# Patient Record
Sex: Female | Born: 1965 | Race: Black or African American | Hispanic: No | Marital: Married | Smoking: Never smoker
Health system: Southern US, Community
[De-identification: ages and names within clinical notes are randomized; demographics above are authoritative.]

## PROBLEM LIST (undated history)

## (undated) DIAGNOSIS — I10 Essential (primary) hypertension: Secondary | ICD-10-CM

## (undated) HISTORY — PX: THYROIDECTOMY, PARTIAL: SHX18

---

## 2007-08-06 HISTORY — PX: BREAST BIOPSY: SHX20

## 2020-09-01 DIAGNOSIS — Z03818 Encounter for observation for suspected exposure to other biological agents ruled out: Secondary | ICD-10-CM | POA: Diagnosis not present

## 2020-09-21 DIAGNOSIS — M1711 Unilateral primary osteoarthritis, right knee: Secondary | ICD-10-CM | POA: Diagnosis not present

## 2020-09-21 DIAGNOSIS — M1712 Unilateral primary osteoarthritis, left knee: Secondary | ICD-10-CM | POA: Diagnosis not present

## 2020-09-21 DIAGNOSIS — M722 Plantar fascial fibromatosis: Secondary | ICD-10-CM | POA: Diagnosis not present

## 2020-09-21 DIAGNOSIS — M7741 Metatarsalgia, right foot: Secondary | ICD-10-CM | POA: Diagnosis not present

## 2020-10-19 DIAGNOSIS — Z20822 Contact with and (suspected) exposure to covid-19: Secondary | ICD-10-CM | POA: Diagnosis not present

## 2020-12-03 ENCOUNTER — Encounter: Payer: Self-pay | Admitting: Intensive Care

## 2020-12-03 ENCOUNTER — Other Ambulatory Visit: Payer: Self-pay

## 2020-12-03 ENCOUNTER — Emergency Department
Admission: EM | Admit: 2020-12-03 | Discharge: 2020-12-03 | Disposition: A | Discharge status: Home / Self Care | Payer: BC Managed Care – PPO | Attending: Physician Assistant | Admitting: Physician Assistant

## 2020-12-03 DIAGNOSIS — T783XXA Angioneurotic edema, initial encounter: Secondary | ICD-10-CM | POA: Diagnosis not present

## 2020-12-03 DIAGNOSIS — T7840XA Allergy, unspecified, initial encounter: Secondary | ICD-10-CM

## 2020-12-03 DIAGNOSIS — I10 Essential (primary) hypertension: Secondary | ICD-10-CM | POA: Insufficient documentation

## 2020-12-03 DIAGNOSIS — K130 Diseases of lips: Secondary | ICD-10-CM | POA: Diagnosis not present

## 2020-12-03 DIAGNOSIS — Z79899 Other long term (current) drug therapy: Secondary | ICD-10-CM | POA: Insufficient documentation

## 2020-12-03 DIAGNOSIS — T50995A Adverse effect of other drugs, medicaments and biological substances, initial encounter: Secondary | ICD-10-CM | POA: Diagnosis not present

## 2020-12-03 HISTORY — DX: Essential (primary) hypertension: I10

## 2020-12-03 MED ORDER — HYDROXYZINE HCL 50 MG PO TABS: 50.0000 mg | ORAL_TABLET | Freq: Three times a day (TID) | ORAL | 0 refills | Status: DC | PRN

## 2020-12-03 MED ORDER — DIPHENHYDRAMINE HCL 50 MG/ML IJ SOLN
25.0000 mg | Freq: Once | INTRAMUSCULAR | Status: AC
  Administered 2020-12-03: 25 mg via INTRAVENOUS
  Filled 2020-12-03: qty 1

## 2020-12-03 MED ORDER — METOCLOPRAMIDE HCL 5 MG/ML IJ SOLN
20.0000 mg | Freq: Once | INTRAVENOUS | Status: AC
  Administered 2020-12-03: 20 mg via INTRAVENOUS
  Filled 2020-12-03: qty 4

## 2020-12-03 MED ORDER — SODIUM CHLORIDE 0.9 % IV BOLUS
1000.0000 mL | Freq: Once | INTRAVENOUS | Status: AC
  Administered 2020-12-03: 1000 mL via INTRAVENOUS

## 2020-12-03 MED ORDER — METHYLPREDNISOLONE 4 MG PO TBPK: ORAL_TABLET | ORAL | 0 refills | Status: DC

## 2020-12-03 MED ORDER — METHYLPREDNISOLONE SODIUM SUCC 125 MG IJ SOLR
80.0000 mg | Freq: Once | INTRAMUSCULAR | Status: AC
  Administered 2020-12-03: 80 mg via INTRAVENOUS
  Filled 2020-12-03: qty 2

## 2020-12-03 NOTE — Discharge Instructions (Addendum)
Discontinue BC powder/aspirin.

## 2020-12-03 NOTE — ED Provider Notes (Signed)
[facility] Emergency Department Provider Note   ____________________________________________   Event Date/Time   First MD Initiated Contact with Patient 12/03/20 [phone]     (approximate)  I have reviewed the triage vital signs and the nursing notes.   HISTORY  Chief Complaint Allergic Reaction    HPI Julia Ashley is a 55 y.o. female patient presents with lip edema and rash after taking a Goody powder this morning.  Patient states the only other oral thing she ate was oatmeal.  Denies dyspnea or other anaphylactic signs or symptoms.        Past Medical History:  Diagnosis Date  . Hypertension     There are no problems to display for this patient.   History reviewed. No pertinent surgical history.  Prior to Admission medications   Medication Sig Start Date End Date Taking? Authorizing Provider  hydrOXYzine (ATARAX/VISTARIL) 50 MG tablet Take 1 tablet (50 mg total) by mouth 3 (three) times daily as needed for itching. 12/03/20  Yes Joni Reining, PA-C  methylPREDNISolone (MEDROL DOSEPAK) 4 MG TBPK tablet Take Tapered dose as directed 12/03/20  Yes Joni Reining, PA-C    Allergies Lisinopril  History reviewed. No pertinent family history.  Social History Social History   Tobacco Use  . Smoking status: Never Smoker  . Smokeless tobacco: Never Used  Substance Use Topics  . Alcohol use: Never  . Drug use: Never    Review of Systems Constitutional: No fever/chills Eyes: No visual changes. ENT: No sore throat. Cardiovascular: Denies chest pain. Respiratory: Denies shortness of breath. Gastrointestinal: No abdominal pain.  No nausea, no vomiting.  No diarrhea.  No constipation. Genitourinary: Negative for dysuria. Musculoskeletal: Negative for back pain. Skin: Positive for rash.  Oral lip edema. Neurological: Negative for headaches, focal weakness or numbness. Allergic/Immunilogical:  Lisinopril  ____________________________________________   PHYSICAL EXAM:  VITAL SIGNS: ED Triage Vitals  Enc Vitals Group     BP 12/03/20 0937 (!) 181/112     Pulse Rate 12/03/20 0937 (!) 110     Resp 12/03/20 0937 20     Temp 12/03/20 0937 99.3 F (37.4 C)     Temp Source 12/03/20 0937 Oral     SpO2 12/03/20 0937 97 %     Weight 12/03/20 0939 261 lb (118.4 kg)     Height 12/03/20 0939 6' (1.829 m)     Head Circumference --      Peak Flow --      Pain Score 12/03/20 0939 0     Pain Loc --      Pain Edu? --      Excl. in GC? --     Constitutional: Alert and oriented. Well appearing and in no acute distress. Eyes: Conjunctivae are normal. PERRL. EOMI. Head: Atraumatic. Nose: No congestion/rhinnorhea. Mouth/Throat: Mucous membranes are moist.  Oropharynx non-erythematous. Neck: No stridor.  Hematological/Lymphatic/Immunilogical: No cervical lymphadenopathy. Cardiovascular: Normal rate, regular rhythm. Grossly normal heart sounds.  Good peripheral circulation.  Elevated blood pressure.  Patient has not taking blood pressure medication this morning.   Respiratory: Normal respiratory effort.  No retractions. Lungs CTAB. Gastrointestinal: Soft and nontender. No distention. No abdominal bruits. No CVA tenderness. Genitourinary: Deferred Musculoskeletal: No lower extremity tenderness nor edema.  No joint effusions. Neurologic:  Normal speech and language. No gross focal neurologic deficits are appreciated. No gait instability. Skin:  Skin is warm, dry and intact.  Diffuse macular rash noted. Psychiatric: Mood and affect are normal. Speech and behavior  are normal.  ____________________________________________   LABS (all labs ordered are listed, but only abnormal results are displayed)  Labs Reviewed - No data to display ____________________________________________  EKG   ____________________________________________  RADIOLOGY I, Joni Reining, personally viewed and  evaluated these images (plain radiographs) as part of my medical decision making, as well as reviewing the written report by the radiologist.  ED MD interpretation:    Official radiology report(s): No results found.  ____________________________________________   PROCEDURES  Procedure(s) performed (including Critical Care):  Procedures   ____________________________________________   INITIAL IMPRESSION / ASSESSMENT AND PLAN / ED COURSE  As part of my medical decision making, I reviewed the following data within the electronic MEDICAL RECORD NUMBER         Patient presents or edema and rash status post taking BC powders this morning.  Patient complaining physical exam consistent with drug rash and mild angioedema.  Patient is responding well to IV steroids and antihistamines.  Patient given discharge care instructions and a prescription for Medrol Dosepak and Atarax.  Patient advised follow-up PCP.  Return to ED if condition worsens.      ____________________________________________   FINAL CLINICAL IMPRESSION(S) / ED DIAGNOSES  Final diagnoses:  Allergic reaction to drug, initial encounter  Angioedema, initial encounter     ED Discharge Orders         Ordered    methylPREDNISolone (MEDROL DOSEPAK) 4 MG TBPK tablet        12/03/20 1116    hydrOXYzine (ATARAX/VISTARIL) 50 MG tablet  3 times daily PRN        12/03/20 1116          *Please note:  [name] was evaluated in Emergency Department on 12/03/2020 for the symptoms described in the history of present illness. She was evaluated in the context of the global COVID-19 pandemic, which necessitated consideration that the patient might be at risk for infection with the SARS-CoV-2 virus that causes COVID-19. Institutional protocols and algorithms that pertain to the evaluation of patients at risk for COVID-19 are in a state of rapid change based on information released by regulatory bodies including the CDC and  federal and state organizations. These policies and algorithms were followed during the patient's care in the ED.  Some ED evaluations and interventions may be delayed as a result of limited staffing during and the pandemic.*   Note:  This document was prepared using Dragon voice recognition software and may include unintentional dictation errors.    Joni Reining, PA-C 12/03/20 1122    Gilles Chiquito, MD 12/03/20 1255

## 2020-12-03 NOTE — ED Triage Notes (Addendum)
Patient c/o allergic reaction to bc goody powder about 20-30 minutes after taking this morning. Patient has rash all over and c/o swelling on face. NAD noted. No trouble breathing. Patient does clear throat every so often

## 2020-12-03 NOTE — ED Notes (Signed)
Provided discharge instructions. Verbalized understanding.  

## 2020-12-18 DIAGNOSIS — Z20822 Contact with and (suspected) exposure to covid-19: Secondary | ICD-10-CM | POA: Diagnosis not present

## 2020-12-18 DIAGNOSIS — Z03818 Encounter for observation for suspected exposure to other biological agents ruled out: Secondary | ICD-10-CM | POA: Diagnosis not present

## 2020-12-21 DIAGNOSIS — T783XXA Angioneurotic edema, initial encounter: Secondary | ICD-10-CM | POA: Diagnosis not present

## 2020-12-21 DIAGNOSIS — E7800 Pure hypercholesterolemia, unspecified: Secondary | ICD-10-CM | POA: Diagnosis not present

## 2020-12-21 DIAGNOSIS — M2041 Other hammer toe(s) (acquired), right foot: Secondary | ICD-10-CM | POA: Diagnosis not present

## 2020-12-21 DIAGNOSIS — M2042 Other hammer toe(s) (acquired), left foot: Secondary | ICD-10-CM | POA: Diagnosis not present

## 2020-12-21 DIAGNOSIS — R718 Other abnormality of red blood cells: Secondary | ICD-10-CM | POA: Diagnosis not present

## 2020-12-21 DIAGNOSIS — Z6841 Body Mass Index (BMI) 40.0 and over, adult: Secondary | ICD-10-CM | POA: Diagnosis not present

## 2020-12-21 DIAGNOSIS — M1712 Unilateral primary osteoarthritis, left knee: Secondary | ICD-10-CM | POA: Diagnosis not present

## 2020-12-21 DIAGNOSIS — L509 Urticaria, unspecified: Secondary | ICD-10-CM | POA: Diagnosis not present

## 2020-12-21 DIAGNOSIS — Z6836 Body mass index (BMI) 36.0-36.9, adult: Secondary | ICD-10-CM | POA: Diagnosis not present

## 2020-12-21 DIAGNOSIS — Z79899 Other long term (current) drug therapy: Secondary | ICD-10-CM | POA: Diagnosis not present

## 2020-12-21 DIAGNOSIS — I10 Essential (primary) hypertension: Secondary | ICD-10-CM | POA: Diagnosis not present

## 2020-12-21 DIAGNOSIS — M1711 Unilateral primary osteoarthritis, right knee: Secondary | ICD-10-CM | POA: Diagnosis not present

## 2020-12-21 DIAGNOSIS — R21 Rash and other nonspecific skin eruption: Secondary | ICD-10-CM | POA: Diagnosis not present

## 2020-12-27 DIAGNOSIS — Z20822 Contact with and (suspected) exposure to covid-19: Secondary | ICD-10-CM | POA: Diagnosis not present

## 2021-01-08 ENCOUNTER — Ambulatory Visit (INDEPENDENT_AMBULATORY_CARE_PROVIDER_SITE_OTHER): Payer: BC Managed Care – PPO | Admitting: Allergy

## 2021-01-08 ENCOUNTER — Other Ambulatory Visit: Payer: Self-pay

## 2021-01-08 ENCOUNTER — Encounter: Payer: Self-pay | Admitting: Allergy

## 2021-01-08 VITALS — BP 142/82 | HR 104 | Temp 98.0°F | Resp 20 | Ht 70.0 in | Wt 262.6 lb

## 2021-01-08 DIAGNOSIS — L508 Other urticaria: Secondary | ICD-10-CM | POA: Diagnosis not present

## 2021-01-08 DIAGNOSIS — T783XXD Angioneurotic edema, subsequent encounter: Secondary | ICD-10-CM

## 2021-01-08 NOTE — Progress Notes (Signed)
New Patient Note  RE: Julia Ashley MRN: [id number] DOB: Jan 28, 1966 Date of Office Visit: 01/08/2021  Referring provider: Mathews Robinsons Primary care provider: Mathews Robinsons., MD  Chief Complaint:  Allergic reactions  History of present illness: Julia Ashley is a 55 y.o. female presenting today for consultation for urticaria/allergic reactions.   She states she has been having allergic reactions to something but states it started occurring after her Moderna 3rd booster in December 2021.  She had no issues with the first 2 vaccines.   She provides pictures of these reactions that do appear to be consistent with urticaria.   She reports she breaks out in this rash daily.  She works from home. She states she went on a vacation for a week and states she had no rash while away from her home and not working.  She does report stress at work.  There is a dog in the home.  The rash is itchy sometimes.  The rash has been mostly on her arms and legs.  The rash usually resolves by end of the day.  Not leaving any bruising marks behind once resolved.  No pains/joint aches.  No fever.  No preceding illnesses.  No change in foods/diet.  No new medications besides Moderna vaccine.   No change in soaps/detergents/body products.  Not taking any medication for this.  Has not noted any specific triggers.  She was seen in the ED on 12/03/2020 for chief complaint of allergic reaction.  She states she developed facial swelling, throat scratchiness after taking BC powder (she states has taken [facility] many times before without issue).  She did have telehealth visit with her PCP who advised she go to ED.   She was noted  on exam to have "diffuse macular rash noted" otherwise normal exam except for an elevated blood pressure.  In the ED she was treated with IV steroids and antihistamines.  She was advised to take Medrol Dosepak and Atarax after discharge.  She reports an allergy to lisinopril where she  developed oral swelling.   No history of asthma, eczema.  She reports a little seasonal allergy symptoms but denies taking medication for it.  Reports some nasal congestion around freshly cut grass.   She did see an allergist about 18 years ago and reports having patch testing done.  Review of systems: Review of Systems  Constitutional: Negative.   HENT: Negative.   Eyes: Negative.   Respiratory: Negative.   Cardiovascular: Negative.   Gastrointestinal: Negative.   Musculoskeletal: Negative.   Skin: Positive for itching and rash.  Neurological: Negative.     All other systems negative unless noted above in HPI  Past medical history: Past Medical History:  Diagnosis Date  . Hypertension     Past surgical history: History reviewed. No pertinent surgical history.  Family history:  History reviewed. No pertinent family history.  Social history: Lives in a townhome with carpeting in the bedroom with gas heating and central cooling.  Dog in the home.  There is no concern for water damage, mildew or roaches in the home.  She is a Engineering geologist and investigates automobile claims.  She denies a smoking history.  Medication List: Current Outpatient Medications  Medication Sig Dispense Refill  . amLODipine (NORVASC) 5 MG tablet Take 1 tablet by mouth daily.    . folic acid (FOLVITE) 800 MCG tablet Take 800 mcg by mouth daily.    . hydrOXYzine (ATARAX/VISTARIL) 50 MG tablet Take 1  tablet (50 mg total) by mouth 3 (three) times daily as needed for itching. 15 tablet 0  . methylPREDNISolone (MEDROL DOSEPAK) 4 MG TBPK tablet Take Tapered dose as directed (Patient not taking: Reported on 01/08/2021) 21 tablet 0   No current facility-administered medications for this visit.    Known medication allergies: Allergies  Allergen Reactions  . Lisinopril Swelling    Hives, swelling, cough, scratchy throat     Physical examination: Blood pressure (!) 142/82, pulse (!) 104, temperature  98 F (36.7 C), temperature source Temporal, resp. rate 20, height 5\' 10"  (1.778 m), weight 262 lb 9.6 oz (119.1 kg), SpO2 97 %.  General: Alert, interactive, in no acute distress. HEENT: PERRLA, TMs pearly gray, turbinates non-edematous without discharge, post-pharynx non erythematous. Neck: Supple without lymphadenopathy. Lungs: Clear to auscultation without wheezing, rhonchi or rales. {no increased work of breathing. CV: Normal S1, S2 without murmurs. Abdomen: Nondistended, nontender. Skin: Warm and dry, without lesions or rashes. Extremities:  No clubbing, cyanosis or edema. Neuro:   Grossly intact.  Diagnositics/Labs: None today  Assessment and plan:   Chronic urticaria (hives) Angioedema (swelling)  - at this time etiology of hives and swelling is unknown but do believe may have been triggered by Covid vaccine.  We have been seeing more episodes of hives/swelling following vaccination due to the immune system activation for antibody production (normal response to vaccination) however the allergy cells are apart of the immune system and also get excited/activated leading to recurrent hive or swelling episodes.  This does not mean you are allergic to the vaccine and can receive future doses if needed but know you could develop further hive flares.  - your facial swelling after taking BC powder most likely is all related to the hives you have been having.   Of note NSAIDs (like ibuprofen, aspirin) are known histamine releasers and can cause hives and swelling as a side effect.  Alcohol, opiates and strawberries are other things that can also trigger hives and swelling from a histamine releasing effect.   -  Hives can be caused by a variety of different triggers including illness/infection, foods, medications, stings, exercise, pressure, vibrations, extremes of temperature to name a few however majority of the time there is no identifiable trigger.  Your symptoms have been ongoing for >6  weeks making this chronic thus will obtain labwork to evaluate: CBC w diff, CMP, tryptase, hive panel, environmental panel, alpha-gal panel, inflammatory markers - for hive/swelling management recommend the following:  Long-acting antihistamine like Zyrtec 10mg , Xyzal 5mg  or Allegra 180mg  1 tab daily with Pepcid 20mg  1 tab daily.    If you continue to have hives/swelling on daily dosing then increase both medications to twice a day.  Let know if you still have hives on twice a day dosing.    Follow-up in 2-3 months or sooner if needed  I appreciate the opportunity to take part in Evelynn's care. Please do not hesitate to contact me with questions.  Sincerely,   , MD Allergy/Immunology [facility]

## 2021-01-08 NOTE — Patient Instructions (Addendum)
Chronic urticaria (hives) Angioedema (swelling)  - at this time etiology of hives and swelling is unknown but do believe may have been triggered by Covid vaccine.  We have been seeing more episodes of hives/swelling following vaccination due to the immune system activation for antibody production (normal response to vaccination) however the allergy cells are apart of the immune system and also get excited/activated leading to recurrent hive or swelling episodes.  This does not mean you are allergic to the vaccine and can receive future doses if needed but know you could develop further hive flares.  - your facial swelling after taking BC powder most likely is all related to the hives you have been having.   Of note NSAIDs (like ibuprofen, aspirin) are known histamine releasers and can cause hives and swelling as a side effect.  Alcohol, opiates and strawberries are other things that can also trigger hives and swelling from a histamine releasing effect.   -  Hives can be caused by a variety of different triggers including illness/infection, foods, medications, stings, exercise, pressure, vibrations, extremes of temperature to name a few however majority of the time there is no identifiable trigger.  Your symptoms have been ongoing for >6 weeks making this chronic thus will obtain labwork to evaluate: CBC w diff, CMP, tryptase, hive panel, environmental panel, alpha-gal panel, inflammatory markers - for hive/swelling management recommend the following:  Long-acting antihistamine like Zyrtec 10mg , Xyzal 5mg  or Allegra 180mg  1 tab daily with Pepcid 20mg  1 tab daily.    If you continue to have hives/swelling on daily dosing then increase both medications to twice a day.  Let know if you still have hives on twice a day dosing.    Follow-up in 2-3 months or sooner if needed

## 2021-01-15 ENCOUNTER — Telehealth: Payer: Self-pay | Admitting: Allergy

## 2021-01-22 DIAGNOSIS — T783XXD Angioneurotic edema, subsequent encounter: Secondary | ICD-10-CM | POA: Diagnosis not present

## 2021-01-22 DIAGNOSIS — L508 Other urticaria: Secondary | ICD-10-CM | POA: Diagnosis not present

## 2021-01-31 LAB — ALLERGENS W/TOTAL IGE AREA 2
Alternaria Alternata IgE: 0.7 kU/L — AB
Aspergillus Fumigatus IgE: 0.53 kU/L — AB
Bermuda Grass IgE: <0.1 kU/L
Cat Dander IgE: 2.68 kU/L — AB
Cedar, Mountain IgE: 0.36 kU/L — AB
Cladosporium Herbarum IgE: <0.1 kU/L
Cockroach, German IgE: 1.75 kU/L — AB
Common Silver Birch IgE: <0.1 kU/L
Cottonwood IgE: <0.1 kU/L
D Farinae IgE: 4.57 kU/L — AB
D Pteronyssinus IgE: 4.29 kU/L — AB
Dog Dander IgE: 1.14 kU/L — AB
Elm, American IgE: <0.1 kU/L
Johnson Grass IgE: 0.12 kU/L — AB
Maple/Box Elder IgE: 0.36 kU/L — AB
Mouse Urine IgE: <0.1 kU/L
Oak, White IgE: 3.08 kU/L — AB
Pecan, Hickory IgE: 0.14 kU/L — AB
Penicillium Chrysogen IgE: <0.1 kU/L
Pigweed, Rough IgE: <0.1 kU/L
Ragweed, Short IgE: 0.85 kU/L — AB
Sheep Sorrel IgE Qn: <0.1 kU/L
Timothy Grass IgE: 1.88 kU/L — AB
White Mulberry IgE: <0.1 kU/L

## 2021-01-31 LAB — COMPREHENSIVE METABOLIC PANEL
ALT: 6 IU/L (ref 0–32)
AST: 13 IU/L (ref 0–40)
Albumin/Globulin Ratio: 1.6 (ref 1.2–2.2)
Albumin: 4 g/dL (ref 3.8–4.9)
Alkaline Phosphatase: 89 IU/L (ref 44–121)
BUN/Creatinine Ratio: 11 (ref 9–23)
BUN: 10 mg/dL (ref 6–24)
Bilirubin Total: 0.3 mg/dL (ref 0.0–1.2)
CO2: 23 mmol/L (ref 20–29)
Calcium: 9 mg/dL (ref 8.7–10.2)
Chloride: 105 mmol/L (ref 96–106)
Creatinine, Ser: 0.95 mg/dL (ref 0.57–1.00)
Globulin, Total: 2.5 g/dL (ref 1.5–4.5)
Glucose: 73 mg/dL (ref 65–99)
Potassium: 4.1 mmol/L (ref 3.5–5.2)
Sodium: 144 mmol/L (ref 134–144)
Total Protein: 6.5 g/dL (ref 6.0–8.5)
eGFR: 71 mL/min/{1.73_m2} (ref >59)

## 2021-01-31 LAB — CHRONIC URTICARIA: cu index: <3.5 (ref <10)

## 2021-01-31 LAB — CBC WITH DIFFERENTIAL
Basophils Absolute: 0 10*3/uL (ref 0.0–0.2)
Basos: 0 %
EOS (ABSOLUTE): 0.2 10*3/uL (ref 0.0–0.4)
Eos: 3 %
Hematocrit: 33.7 % — ABNORMAL LOW (ref 34.0–46.6)
Hemoglobin: 11.6 g/dL (ref 11.1–15.9)
Immature Grans (Abs): 0 10*3/uL (ref 0.0–0.1)
Immature Granulocytes: 0 %
Lymphocytes Absolute: 1.8 10*3/uL (ref 0.7–3.1)
Lymphs: 29 %
MCH: 32.4 pg (ref 26.6–33.0)
MCHC: 34.4 g/dL (ref 31.5–35.7)
MCV: 94 fL (ref 79–97)
Monocytes Absolute: 0.2 10*3/uL (ref 0.1–0.9)
Monocytes: 4 %
Neutrophils Absolute: 3.9 10*3/uL (ref 1.4–7.0)
Neutrophils: 64 %
RBC: 3.58 x10E6/uL — ABNORMAL LOW (ref 3.77–5.28)
RDW: 12.5 % (ref 11.7–15.4)
WBC: 6.1 10*3/uL (ref 3.4–10.8)

## 2021-01-31 LAB — ALPHA-GAL PANEL
Allergen Lamb IgE: <0.1 kU/L
Beef IgE: <0.1 kU/L
IgE (Immunoglobulin E), Serum: 328 IU/mL (ref 6–495)
O215-IgE Alpha-Gal: <0.1 kU/L
Pork IgE: <0.1 kU/L

## 2021-01-31 LAB — SEDIMENTATION RATE: Sed Rate: 50 mm/hr — ABNORMAL HIGH (ref 0–40)

## 2021-01-31 LAB — TRYPTASE: Tryptase: 8 ug/L (ref 2.2–13.2)

## 2021-01-31 LAB — THYROID ANTIBODIES
Thyroglobulin Antibody: <1 IU/mL (ref 0.0–0.9)
Thyroperoxidase Ab SerPl-aCnc: <8 IU/mL (ref 0–34)

## 2021-03-08 DIAGNOSIS — M549 Dorsalgia, unspecified: Secondary | ICD-10-CM | POA: Diagnosis not present

## 2021-03-19 ENCOUNTER — Ambulatory Visit: Payer: BC Managed Care – PPO | Admitting: Allergy

## 2021-03-19 DIAGNOSIS — J309 Allergic rhinitis, unspecified: Secondary | ICD-10-CM

## 2021-03-27 DIAGNOSIS — M170 Bilateral primary osteoarthritis of knee: Secondary | ICD-10-CM | POA: Diagnosis not present

## 2021-03-27 DIAGNOSIS — M25551 Pain in right hip: Secondary | ICD-10-CM | POA: Diagnosis not present

## 2021-03-27 DIAGNOSIS — M1711 Unilateral primary osteoarthritis, right knee: Secondary | ICD-10-CM | POA: Diagnosis not present

## 2021-03-27 DIAGNOSIS — M1712 Unilateral primary osteoarthritis, left knee: Secondary | ICD-10-CM | POA: Diagnosis not present

## 2021-03-27 DIAGNOSIS — M1611 Unilateral primary osteoarthritis, right hip: Secondary | ICD-10-CM | POA: Diagnosis not present

## 2021-05-07 ENCOUNTER — Other Ambulatory Visit: Payer: Self-pay | Admitting: Internal Medicine

## 2021-05-07 DIAGNOSIS — E7800 Pure hypercholesterolemia, unspecified: Secondary | ICD-10-CM | POA: Diagnosis not present

## 2021-05-07 DIAGNOSIS — R718 Other abnormality of red blood cells: Secondary | ICD-10-CM | POA: Diagnosis not present

## 2021-05-07 DIAGNOSIS — E538 Deficiency of other specified B group vitamins: Secondary | ICD-10-CM | POA: Diagnosis not present

## 2021-05-07 DIAGNOSIS — R700 Elevated erythrocyte sedimentation rate: Secondary | ICD-10-CM | POA: Diagnosis not present

## 2021-05-07 DIAGNOSIS — Z6838 Body mass index (BMI) 38.0-38.9, adult: Secondary | ICD-10-CM | POA: Diagnosis not present

## 2021-05-07 DIAGNOSIS — Z0001 Encounter for general adult medical examination with abnormal findings: Secondary | ICD-10-CM | POA: Diagnosis not present

## 2021-05-07 DIAGNOSIS — Z1231 Encounter for screening mammogram for malignant neoplasm of breast: Secondary | ICD-10-CM

## 2021-05-07 DIAGNOSIS — I10 Essential (primary) hypertension: Secondary | ICD-10-CM | POA: Diagnosis not present

## 2021-05-12 DIAGNOSIS — Z20822 Contact with and (suspected) exposure to covid-19: Secondary | ICD-10-CM | POA: Diagnosis not present

## 2021-05-22 DIAGNOSIS — M79671 Pain in right foot: Secondary | ICD-10-CM | POA: Diagnosis not present

## 2021-05-22 DIAGNOSIS — M79672 Pain in left foot: Secondary | ICD-10-CM | POA: Diagnosis not present

## 2021-05-22 DIAGNOSIS — M21621 Bunionette of right foot: Secondary | ICD-10-CM | POA: Diagnosis not present

## 2021-05-24 DIAGNOSIS — Z20822 Contact with and (suspected) exposure to covid-19: Secondary | ICD-10-CM | POA: Diagnosis not present

## 2021-06-04 ENCOUNTER — Ambulatory Visit
Admission: RE | Admit: 2021-06-04 | Discharge: 2021-06-04 | Disposition: A | Discharge status: Home / Self Care | Payer: BC Managed Care – PPO | Source: Ambulatory Visit | Attending: Internal Medicine | Admitting: Internal Medicine

## 2021-06-04 ENCOUNTER — Other Ambulatory Visit: Payer: Self-pay

## 2021-06-04 DIAGNOSIS — Z1231 Encounter for screening mammogram for malignant neoplasm of breast: Secondary | ICD-10-CM | POA: Insufficient documentation

## 2021-06-11 ENCOUNTER — Other Ambulatory Visit: Payer: Self-pay

## 2021-06-11 ENCOUNTER — Encounter: Payer: BC Managed Care – PPO | Attending: Physician Assistant | Admitting: Physician Assistant

## 2021-06-11 DIAGNOSIS — L97812 Non-pressure chronic ulcer of other part of right lower leg with fat layer exposed: Secondary | ICD-10-CM | POA: Diagnosis not present

## 2021-06-11 DIAGNOSIS — X58XXXA Exposure to other specified factors, initial encounter: Secondary | ICD-10-CM | POA: Insufficient documentation

## 2021-06-11 DIAGNOSIS — I10 Essential (primary) hypertension: Secondary | ICD-10-CM | POA: Insufficient documentation

## 2021-06-11 DIAGNOSIS — S81811A Laceration without foreign body, right lower leg, initial encounter: Secondary | ICD-10-CM | POA: Insufficient documentation

## 2021-06-13 ENCOUNTER — Other Ambulatory Visit: Payer: Self-pay

## 2021-06-13 DIAGNOSIS — L97812 Non-pressure chronic ulcer of other part of right lower leg with fat layer exposed: Secondary | ICD-10-CM | POA: Diagnosis not present

## 2021-06-13 DIAGNOSIS — I10 Essential (primary) hypertension: Secondary | ICD-10-CM | POA: Diagnosis not present

## 2021-06-13 DIAGNOSIS — S81811A Laceration without foreign body, right lower leg, initial encounter: Secondary | ICD-10-CM | POA: Diagnosis not present

## 2021-06-13 DIAGNOSIS — X58XXXA Exposure to other specified factors, initial encounter: Secondary | ICD-10-CM | POA: Diagnosis not present

## 2021-06-14 NOTE — Progress Notes (Signed)
ADELINE, PETITFRERE ([id number]) Visit Report for 06/13/2021 Arrival Information Details Patient Name: Julia Ashley, Julia Ashley Date of Service: 06/13/2021 3:45 PM Medical Record Number: [id number] Patient Account Number: [id number] Date of Birth/Sex: [birth year] (55 y.o. F) Treating RN: Yevonne Pax Primary Care Adnan Vanvoorhis: Aurora Mask Other Clinician: Referring Vannesa Abair: Aurora Mask Treating Sheletha Bow/Extender: Tilda Franco in Treatment: 0 Visit Information History Since Last Visit All ordered tests and consults were completed: No Patient Arrived: Ambulatory Added or deleted any medications: No Arrival Time: 15:25 Any new allergies or adverse reactions: No Accompanied By: self Had a fall or experienced change in No Transfer Assistance: None activities of daily living that may affect Patient Identification Verified: Yes risk of falls: Secondary Verification Process Completed: Yes Signs or symptoms of abuse/neglect since last visito No Patient Requires Transmission-Based Precautions: No Hospitalized since last visit: No Patient Has Alerts: No Implantable device outside of the clinic excluding No cellular tissue based products placed in the center since last visit: Has Dressing in Place as Prescribed: Yes Has Compression in Place as Prescribed: Yes Pain Present Now: No Electronic Signature(s) Signed: 06/13/2021 4:25:42 PM By: Yevonne Pax RN Entered By: Yevonne Pax on 06/13/2021 16:25:42 Julia Ashley ([id number]) -------------------------------------------------------------------------------- Clinic Level of Care Assessment Details Patient Name: Julia Ashley Date of Service: 06/13/2021 3:45 PM Medical Record Number: [id number] Patient Account Number: [id number] Date of Birth/Sex: 05-27-1966 (55 y.o. F) Treating RN: Yevonne Pax Primary Care Celester Morgan: Aurora Mask Other Clinician: Referring Chanel Mcadams: Aurora Mask Treating Tobi Leinweber/Extender:  Tilda Franco in Treatment: 0 Clinic Level of Care Assessment Items TOOL 1 Quantity Score []  - Use when EandM and Procedure is performed on INITIAL visit 0 ASSESSMENTS - Nursing Assessment / Reassessment []  - General Physical Exam (combine w/ comprehensive assessment (listed just below) when performed on new 0 pt. evals) []  - 0 Comprehensive Assessment (HX, ROS, Risk Assessments, Wounds Hx, etc.) ASSESSMENTS - Wound and Skin Assessment / Reassessment []  - Dermatologic / Skin Assessment (not related to wound area) 0 ASSESSMENTS - Ostomy and/or Continence Assessment and Care []  - Incontinence Assessment and Management 0 []  - 0 Ostomy Care Assessment and Management (repouching, etc.) PROCESS - Coordination of Care []  - Simple Patient / Family Education for ongoing care 0 []  - 0 Complex (extensive) Patient / Family Education for ongoing care []  - 0 Staff obtains , Records, Test Results / Process Orders []  - 0 Staff telephones HHA, Nursing Homes / Clarify orders / etc []  - 0 Routine Transfer to another Facility (non-emergent condition) []  - 0 Routine Hospital Admission (non-emergent condition) []  - 0 New Admissions / / Ordering NPWT, Apligraf, etc. []  - 0 Emergency Hospital Admission (emergent condition) PROCESS - Special Needs []  - Pediatric / Minor Patient Management 0 []  - 0 Isolation Patient Management []  - 0 Hearing / Language / Visual special needs []  - 0 Assessment of Community assistance (transportation, D/C planning, etc.) []  - 0 Additional assistance / Altered mentation []  - 0 Support Surface(s) Assessment (bed, cushion, seat, etc.) INTERVENTIONS - Miscellaneous []  - External ear exam 0 []  - 0 Patient Transfer (multiple staff / / Similar devices) []  - 0 Simple Staple / Suture removal (25 or less) []  - 0 Complex Staple / Suture removal (26 or more) []  - 0 Hypo/Hyperglycemic Management (do not check if  billed separately) []  - 0 Ankle / Brachial Index (ABI) - do not check if billed separately Has the patient been seen at the hospital within the last three years: Yes Total  Score: 0 Level Of Care: ____ Julia Ashley ([id number]) Electronic Signature(s) Unsigned Entered By: Yevonne Pax on 06/13/2021 16:28:51 Signature(s): Date(s): Julia Ashley ([id number]) -------------------------------------------------------------------------------- Compression Therapy Details Patient Name: Julia Ashley Date of Service: 06/13/2021 3:45 PM Medical Record Number: [id number] Patient Account Number: [id number] Date of Birth/Sex: 21-Apr-1966 (55 y.o. F) Treating RN: Yevonne Pax Primary Care Mio Schellinger: Aurora Mask Other Clinician: Referring Brant Peets: Aurora Mask Treating Elbie Statzer/Extender: Tilda Franco in Treatment: 0 Compression Therapy Performed for Wound Assessment: Wound #1 Right,Medial Lower Leg Performed By: Clinician Yevonne Pax, RN Compression Type: Four Layer Electronic Signature(s) Signed: 06/13/2021 4:26:33 PM By: Yevonne Pax RN Entered By: Yevonne Pax on 06/13/2021 16:26:33 Julia Ashley ([id number]) -------------------------------------------------------------------------------- Encounter Discharge Information Details Patient Name: Julia Ashley Date of Service: 06/13/2021 3:45 PM Medical Record Number: [id number] Patient Account Number: [id number] Date of Birth/Sex: 30-Jun-1966 (55 y.o. F) Treating RN: Yevonne Pax Primary Care Shahmeer Bunn: Aurora Mask Other Clinician: Referring Swara Donze: Aurora Mask Treating Vanassa Penniman/Extender: Tilda Franco in Treatment: 0 Encounter Discharge Information Items Discharge Condition: Stable Ambulatory Status: Ambulatory Discharge Destination: Home Transportation: Private Auto Accompanied By: self Schedule Follow-up Appointment: Yes Clinical Summary of Care: Patient Declined Electronic  Signature(s) Signed: 06/13/2021 4:28:43 PM By: Yevonne Pax RN Entered By: Yevonne Pax on 06/13/2021 16:28:43 Julia Ashley ([id number]) -------------------------------------------------------------------------------- Wound Assessment Details Patient Name: Julia Ashley Date of Service: 06/13/2021 3:45 PM Medical Record Number: [id number] Patient Account Number: [id number] Date of Birth/Sex: 05/28/1966 (55 y.o. F) Treating RN: Yevonne Pax Primary Care Evalene Vath: Aurora Mask Other Clinician: Referring Holdyn Poyser: Aurora Mask Treating Malajah Oceguera/Extender: Tilda Franco in Treatment: 0 Wound Status Wound Number: 1 Primary Etiology: Trauma, Other Wound Location: Right, Medial Lower Leg Wound Status: Open Wounding Event: Trauma Comorbid History: Hypertension Date Acquired: 05/30/2021 Weeks Of Treatment: 0 Clustered Wound: No Wound Measurements Length: (cm) 6.5 Width: (cm) 9 Depth: (cm) 0.3 Area: (cm) 45.946 Volume: (cm) 13.784 % Reduction in Area: 0% % Reduction in Volume: 0% Epithelialization: None Tunneling: No Undermining: No Wound Description Classification: Full Thickness Without Exposed Support Structu Exudate Amount: Medium Exudate Type: Serosanguineous Exudate Color: red, brown res Foul Odor After Cleansing: No Slough/Fibrino Yes Wound Bed Granulation Amount: Medium (34-66%) Exposed Structure Granulation Quality: Red, Pink Fascia Exposed: No Necrotic Amount: Medium (34-66%) Fat Layer (Subcutaneous Tissue) Exposed: Yes Necrotic Quality: Adherent Slough Tendon Exposed: No Muscle Exposed: No Joint Exposed: No Bone Exposed: No Treatment Notes Wound #1 (Lower Leg) Wound Laterality: Right, Medial Cleanser Normal Saline Discharge Instruction: Wash your hands with soap and water. Remove old dressing, discard into plastic bag and place into trash. Cleanse the wound with Normal Saline prior to applying a clean dressing using gauze sponges,  not tissues or cotton balls. Do not scrub or use excessive force. Pat dry using gauze sponges, not tissue or cotton balls. Soap and Water Discharge Instruction: Gently cleanse wound with antibacterial soap, rinse and pat dry prior to dressing wounds Peri-Wound Care Topical Primary Dressing Hydrofera Blue Ready Transfer Foam, 4x5 (in/in) Discharge Instruction: Apply Hydrofera Blue Ready to wound bed as directed Secondary Dressing Zetuvit Plus Silicone Non-bordered 5x5 (in/in) Secured With Compression [place], Kaitlyne ([id number]) Medichoice 4 layer Compression System, 35-40 mmHG Discharge Instruction: Apply multi-layer wrap as directed. Compression Stockings Add-Ons Electronic Signature(s) Signed: 06/13/2021 4:25:58 PM By: Yevonne Pax RN Entered By: Yevonne Pax on 06/13/2021 16:25:57

## 2021-06-15 NOTE — Progress Notes (Signed)
Julia Ashley, Julia Ashley ([id number]) Visit Report for 06/11/2021 Abuse/Suicide Risk Screen Details Patient Name: Julia Ashley, Julia Ashley Date of Service: 06/11/2021 8:45 AM Medical Record Number: [id number] Patient Account Number: [id number] Date of Birth/Sex: 1965/12/11 (55 y.o. F) Treating RN: Yevonne Pax Primary Care Betul Brisky: Aurora Mask Other Clinician: Referring Kaydra Borgen: Aurora Mask Treating Nevah Dalal/Extender: Rowan Blase in Treatment: 0 Abuse/Suicide Risk Screen Items Answer ABUSE RISK SCREEN: Has anyone close to you tried to hurt or harm you recentlyo No Do you feel uncomfortable with anyone in your familyo No Has anyone forced you do things that you didnot want to doo No Electronic Signature(s) Signed: 06/15/2021 3:56:47 PM By: Yevonne Pax RN Entered By: Yevonne Pax on 06/11/2021 08:52:27 Julia Ashley ([id number]) -------------------------------------------------------------------------------- Activities of Daily Living Details Patient Name: Julia Ashley Date of Service: 06/11/2021 8:45 AM Medical Record Number: [id number] Patient Account Number: [id number] Date of Birth/Sex: 1966-04-11 (55 y.o. F) Treating RN: Yevonne Pax Primary Care Dandra Velardi: Aurora Mask Other Clinician: Referring Shimeka Bacot: Aurora Mask Treating Lahela Woodin/Extender: Rowan Blase in Treatment: 0 Activities of Daily Living Items Answer Activities of Daily Living (Please select one for each item) Drive Automobile Completely Able Take Medications Completely Able Use Telephone Completely Able Care for Appearance Completely Able Use Toilet Completely Able Bath / Shower Completely Able Dress Self Completely Able Feed Self Completely Able Walk Completely Able Get In / Out Bed Completely Able Housework Completely Able Prepare Meals Completely Able Handle Money Completely Able Shop for Self Completely Able Electronic Signature(s) Signed: 06/15/2021 3:56:47 PM By:  Yevonne Pax RN Entered By: Yevonne Pax on 06/11/2021 08:52:48 Julia Ashley ([id number]) -------------------------------------------------------------------------------- Education Screening Details Patient Name: Julia Ashley Date of Service: 06/11/2021 8:45 AM Medical Record Number: [id number] Patient Account Number: [id number] Date of Birth/Sex: 03/03/1966 (55 y.o. F) Treating RN: Yevonne Pax Primary Care Rodman Recupero: Aurora Mask Other Clinician: Referring Zia Najera: Aurora Mask Treating Boleslaw Borghi/Extender: Rowan Blase in Treatment: 0 Primary Learner Assessed: Patient Learning Preferences/Education Level/Primary Language Learning Preference: Explanation Highest Education Level: College or Above Preferred Language: English Cognitive Barrier Language Barrier: No Translator Needed: No Memory Deficit: No Emotional Barrier: No Cultural/Religious Beliefs Affecting Medical Care: No Physical Barrier Impaired Vision: Yes Glasses Impaired Hearing: No Decreased Hand dexterity: No Knowledge/Comprehension Knowledge Level: Medium Comprehension Level: High Ability to understand written instructions: High Ability to understand verbal instructions: High Motivation Anxiety Level: Anxious Cooperation: Cooperative Education Importance: Acknowledges Need Interest in Health Problems: Asks Questions Perception: Coherent Willingness to Engage in Self-Management High Activities: Readiness to Engage in Self-Management High Activities: Electronic Signature(s) Signed: 06/15/2021 3:56:47 PM By: Yevonne Pax RN Entered By: Yevonne Pax on 06/11/2021 08:53:22 Julia Ashley ([id number]) -------------------------------------------------------------------------------- Fall Risk Assessment Details Patient Name: Julia Ashley Date of Service: 06/11/2021 8:45 AM Medical Record Number: [id number] Patient Account Number: [id number] Date of Birth/Sex: 1966/03/25 (55 y.o.  F) Treating RN: Yevonne Pax Primary Care Casondra Gasca: Aurora Mask Other Clinician: Referring Jilliam Bellmore: Aurora Mask Treating Trenee Igoe/Extender: Rowan Blase in Treatment: 0 Fall Risk Assessment Items Have you had 2 or more falls in the last 12 monthso 0 No Have you had any fall that resulted in injury in the last 12 monthso 0 No FALLS RISK SCREEN History of falling - immediate or within 3 months 0 No Secondary diagnosis (Do you have 2 or more medical diagnoseso) 0 No Ambulatory aid None/bed rest/wheelchair/nurse 0 No Crutches/cane/walker 0 No Furniture 0 No Intravenous therapy Access/Saline/Heparin Lock 0 No Gait/Transferring Normal/ bed rest/ wheelchair 0 No Weak (short steps with or without shuffle, stooped but able to  lift head while walking, may 0 No seek support from furniture) Impaired (short steps with shuffle, may have difficulty arising from chair, head down, impaired 0 No balance) Mental Status Oriented to own ability 0 No Electronic Signature(s) Signed: 06/15/2021 3:56:47 PM By: Yevonne Pax RN Entered By: Yevonne Pax on 06/11/2021 08:53:43 Julia Ashley ([id number]) -------------------------------------------------------------------------------- Foot Assessment Details Patient Name: Julia Ashley Date of Service: 06/11/2021 8:45 AM Medical Record Number: [id number] Patient Account Number: [id number] Date of Birth/Sex: 06/06/1966 (55 y.o. F) Treating RN: Yevonne Pax Primary Care Amory Simonetti: Aurora Mask Other Clinician: Referring Alizay Bronkema: Aurora Mask Treating Lucely Leard/Extender: Rowan Blase in Treatment: 0 Foot Assessment Items Site Locations + = Sensation present, - = Sensation absent, C = Callus, U = Ulcer R = Redness, W = Warmth, M = Maceration, PU = Pre-ulcerative lesion F = Fissure, S = Swelling, D = Dryness Assessment Right: Left: Other Deformity: No No Prior Foot Ulcer: No No Prior Amputation: No  No Charcot Joint: No No Ambulatory Status: Ambulatory Without Help Gait: Steady Electronic Signature(s) Signed: 06/15/2021 3:56:47 PM By: Yevonne Pax RN Entered By: Yevonne Pax on 06/11/2021 09:03:21 Julia Ashley ([id number]) -------------------------------------------------------------------------------- Nutrition Risk Screening Details Patient Name: Julia Ashley Date of Service: 06/11/2021 8:45 AM Medical Record Number: [id number] Patient Account Number: [id number] Date of Birth/Sex: 1966/02/15 (55 y.o. F) Treating RN: Yevonne Pax Primary Care Rynn Markiewicz: Aurora Mask Other Clinician: Referring Vikkie Goeden: Aurora Mask Treating Dee Paden/Extender: Rowan Blase in Treatment: 0 Height (in): 72 Weight (lbs): 277 Body Mass Index (BMI): 37.6 Nutrition Risk Screening Items Score Screening NUTRITION RISK SCREEN: I have an illness or condition that made me change the kind and/or amount of food I eat 0 No I eat fewer than two meals per day 0 No I eat few fruits and vegetables, or milk products 0 No I have three or more drinks of beer, liquor or wine almost every day 0 No I have tooth or mouth problems that make it hard for me to eat 0 No I don't always have enough money to buy the food I need 0 No I eat alone most of the time 0 No I take three or more different prescribed or over-the-counter drugs a day 1 Yes Without wanting to, I have lost or gained 10 pounds in the last six months 0 No I am not always physically able to shop, cook and/or feed myself 0 No Nutrition Protocols Good Risk Protocol 0 No interventions needed Moderate Risk Protocol High Risk Proctocol Risk Level: Good Risk Score: 1 Electronic Signature(s) Signed: 06/15/2021 3:56:47 PM By: Yevonne Pax RN Entered By: Yevonne Pax on 06/11/2021 08:53:50

## 2021-06-15 NOTE — Progress Notes (Signed)
Julia Ashley, Julia Ashley ([id number]) Visit Report for 06/11/2021 Allergy List Details Patient Name: Julia Ashley Date of Service: 06/11/2021 8:45 AM Medical Record Number: [id number] Patient Account Number: [id number] Date of Birth/Sex: 02-12-1966 (55 y.o. F) Treating RN: Yevonne Pax Primary Care Zoella Roberti: Aurora Mask Other Clinician: Referring Victorio Creeden: Aurora Mask Treating Nakai Yard/Extender: Rowan Blase in Treatment: 0 Allergies Active Allergies lisinopril aspirin Allergy Notes Electronic Signature(s) Signed: 06/15/2021 3:56:47 PM By: Yevonne Pax RN Entered By: Yevonne Pax on 06/11/2021 08:50:23 Julia Ashley ([id number]) -------------------------------------------------------------------------------- Arrival Information Details Patient Name: Julia Ashley Date of Service: 06/11/2021 8:45 AM Medical Record Number: [id number] Patient Account Number: [id number] Date of Birth/Sex: 1965-12-19 (55 y.o. F) Treating RN: Yevonne Pax Primary Care Giacomo Valone: Aurora Mask Other Clinician: Referring Barri Neidlinger: Aurora Mask Treating Azyiah Bo/Extender: Rowan Blase in Treatment: 0 Visit Information Patient Arrived: Ambulatory Arrival Time: 08:42 Accompanied By: self Transfer Assistance: None Patient Identification Verified: Yes Secondary Verification Process Completed: Yes Patient Requires Transmission-Based Precautions: No Patient Has Alerts: No Electronic Signature(s) Signed: 06/15/2021 3:56:47 PM By: Yevonne Pax RN Entered By: Yevonne Pax on 06/11/2021 08:47:19 Julia Ashley ([id number]) -------------------------------------------------------------------------------- Clinic Level of Care Assessment Details Patient Name: Julia Ashley Date of Service: 06/11/2021 8:45 AM Medical Record Number: [id number] Patient Account Number: [id number] Date of Birth/Sex: 07-14-1966 (55 y.o. F) Treating RN: Yevonne Pax Primary Care Kees Idrovo:  Aurora Mask Other Clinician: Referring Marlon Vonruden: Aurora Mask Treating Milan Clare/Extender: Rowan Blase in Treatment: 0 Clinic Level of Care Assessment Items TOOL 1 Quantity Score X - Use when EandM and Procedure is performed on INITIAL visit 1 0 ASSESSMENTS - Nursing Assessment / Reassessment X - General Physical Exam (combine w/ comprehensive assessment (listed just below) when performed on new 1 20 pt. evals) X- 1 25 Comprehensive Assessment (HX, ROS, Risk Assessments, Wounds Hx, etc.) ASSESSMENTS - Wound and Skin Assessment / Reassessment []  - Dermatologic / Skin Assessment (not related to wound area) 0 ASSESSMENTS - Ostomy and/or Continence Assessment and Care []  - Incontinence Assessment and Management 0 []  - 0 Ostomy Care Assessment and Management (repouching, etc.) PROCESS - Coordination of Care X - Simple Patient / Family Education for ongoing care 1 15 []  - 0 Complex (extensive) Patient / Family Education for ongoing care []  - 0 Staff obtains , Records, Test Results / Process Orders []  - 0 Staff telephones HHA, Nursing Homes / Clarify orders / etc []  - 0 Routine Transfer to another Facility (non-emergent condition) []  - 0 Routine Hospital Admission (non-emergent condition) X- 1 15 New Admissions / / Ordering NPWT, Apligraf, etc. []  - 0 Emergency Hospital Admission (emergent condition) PROCESS - Special Needs []  - Pediatric / Minor Patient Management 0 []  - 0 Isolation Patient Management []  - 0 Hearing / Language / Visual special needs []  - 0 Assessment of Community assistance (transportation, D/C planning, etc.) []  - 0 Additional assistance / Altered mentation []  - 0 Support Surface(s) Assessment (bed, cushion, seat, etc.) INTERVENTIONS - Miscellaneous []  - External ear exam 0 []  - 0 Patient Transfer (multiple staff / / Similar devices) []  - 0 Simple Staple / Suture removal (25 or less) []   - 0 Complex Staple / Suture removal (26 or more) []  - 0 Hypo/Hyperglycemic Management (do not check if billed separately) X- 1 15 Ankle / Brachial Index (ABI) - do not check if billed separately Has the patient been seen at the hospital within the last three years: Yes Total Score: 90 Level Of Care: New/Established - Level 3 [employer], [place] ( )  Electronic Signature(s) Signed: 06/15/2021 3:56:47 PM By: Yevonne Pax RN Entered By: Yevonne Pax on 06/11/2021 09:33:55 Julia Ashley ([id number]) -------------------------------------------------------------------------------- Encounter Discharge Information Details Patient Name: Julia Ashley Date of Service: 06/11/2021 8:45 AM Medical Record Number: [id number] Patient Account Number: [id number] Date of Birth/Sex: Sep 14, 1965 (55 y.o. F) Treating RN: Yevonne Pax Primary Care Cruise Baumgardner: Aurora Mask Other Clinician: Referring Jarmon Javid: Aurora Mask Treating Young Brim/Extender: Rowan Blase in Treatment: 0 Encounter Discharge Information Items Post Procedure Vitals Discharge Condition: Stable Temperature (F): 98.8 Ambulatory Status: Ambulatory Pulse (bpm): 101 Discharge Destination: Home Respiratory Rate (breaths/min): 18 Transportation: Private Auto Blood Pressure (mmHg): 163/109 Accompanied By: self Schedule Follow-up Appointment: Yes Clinical Summary of Care: Patient Declined Electronic Signature(s) Signed: 06/15/2021 3:56:47 PM By: Yevonne Pax RN Entered By: Yevonne Pax on 06/11/2021 09:35:05 Julia Ashley ([id number]) -------------------------------------------------------------------------------- Lower Extremity Assessment Details Patient Name: Julia Ashley Date of Service: 06/11/2021 8:45 AM Medical Record Number: [id number] Patient Account Number: [id number] Date of Birth/Sex: [birth year] (55 y.o. F) Treating RN: Yevonne Pax Primary Care Latanya Hemmer: Aurora Mask Other  Clinician: Referring Saajan Willmon: Aurora Mask Treating Falan Hensler/Extender: Rowan Blase in Treatment: 0 Edema Assessment Assessed: [Left: No] [Right: No] Edema: [Left: Ye] [Right: s] Calf Left: Right: Point of Measurement: 40 cm From Medial Instep 46 cm Ankle Left: Right: Point of Measurement: 11 cm From Medial Instep 32 cm Knee To Floor Left: Right: From Medial Instep 52 cm Vascular Assessment Pulses: Dorsalis Pedis Palpable: [Right:Yes] Doppler Audible: [Right:Yes] Blood Pressure: Brachial: [Right:163] Ankle: [Right:Dorsalis Pedis: 170 1.04] Electronic Signature(s) Signed: 06/15/2021 3:56:47 PM By: Yevonne Pax RN Entered By: Yevonne Pax on 06/11/2021 09:20:39 Julia Ashley ([id number]) -------------------------------------------------------------------------------- Multi Wound Chart Details Patient Name: Julia Ashley Date of Service: 06/11/2021 8:45 AM Medical Record Number: [id number] Patient Account Number: [id number] Date of Birth/Sex: September 19, 1965 (55 y.o. F) Treating RN: Yevonne Pax Primary Care Rilan Eiland: Aurora Mask Other Clinician: Referring Naylin Burkle: Aurora Mask Treating Geovani Tootle/Extender: Rowan Blase in Treatment: 0 Vital Signs Height(in): 72 Pulse(bpm): 101 Weight(lbs): 277 Blood Pressure(mmHg): 163/109 Body Mass Index(BMI): 38 Temperature(F): 98.8 Respiratory Rate(breaths/min): 18 Photos: [N/A:N/A] Wound Location: Right, Medial Lower Leg N/A N/A Wounding Event: Trauma N/A N/A Primary Etiology: Trauma, Other N/A N/A Comorbid History: Hypertension N/A N/A Date Acquired: 05/30/2021 N/A N/A Weeks of Treatment: 0 N/A N/A Wound Status: Open N/A N/A Measurements L x W x D (cm) 6.5x9x0.3 N/A N/A Area (cm) : 45.946 N/A N/A Volume (cm) : 13.784 N/A N/A Classification: Full Thickness Without Exposed N/A N/A Support Structures Exudate Amount: Medium N/A N/A Exudate Type: Serosanguineous N/A N/A Exudate Color:  red, brown N/A N/A Granulation Amount: Medium (34-66%) N/A N/A Granulation Quality: Red, Pink N/A N/A Necrotic Amount: Medium (34-66%) N/A N/A Exposed Structures: Fat Layer (Subcutaneous Tissue): N/A N/A Yes Fascia: No Tendon: No Muscle: No Joint: No Bone: No Epithelialization: None N/A N/A Treatment Notes Electronic Signature(s) Signed: 06/15/2021 3:56:47 PM By: Yevonne Pax RN Entered By: Yevonne Pax on 06/11/2021 09:24:39 Julia Ashley ([id number]) -------------------------------------------------------------------------------- Multi-Disciplinary Care Plan Details Patient Name: Julia Ashley Date of Service: 06/11/2021 8:45 AM Medical Record Number: [id number] Patient Account Number: [id number] Date of Birth/Sex: 1966-05-12 (55 y.o. F) Treating RN: Yevonne Pax Primary Care Sarahi Borland: Aurora Mask Other Clinician: Referring Marzelle Rutten: Aurora Mask Treating Winthrop Shannahan/Extender: Rowan Blase in Treatment: 0 Active Inactive Wound/Skin Impairment Nursing Diagnoses: Knowledge deficit related to ulceration/compromised skin integrity Goals: Patient/caregiver will verbalize understanding of skin care regimen Date Initiated: 06/11/2021 Target Resolution Date: 07/11/2021 Goal Status: Active Ulcer/skin breakdown will have a volume reduction of 30%  by week 4 Date Initiated: 06/11/2021 Target Resolution Date: 08/11/2021 Goal Status: Active Ulcer/skin breakdown will have a volume reduction of 50% by week 8 Date Initiated: 06/11/2021 Target Resolution Date: 09/11/2021 Goal Status: Active Ulcer/skin breakdown will have a volume reduction of 80% by week 12 Date Initiated: 06/11/2021 Target Resolution Date: 10/09/2021 Goal Status: Active Ulcer/skin breakdown will heal within 14 weeks Date Initiated: 06/11/2021 Target Resolution Date: 11/09/2021 Goal Status: Active Interventions: Assess patient/caregiver ability to obtain necessary supplies Assess patient/caregiver  ability to perform ulcer/skin care regimen upon admission and as needed Assess ulceration(s) every visit Notes: Electronic Signature(s) Signed: 06/15/2021 3:56:47 PM By: Yevonne Pax RN Entered By: Yevonne Pax on 06/11/2021 09:23:46 Julia Ashley ([id number]) -------------------------------------------------------------------------------- Pain Assessment Details Patient Name: Julia Ashley Date of Service: 06/11/2021 8:45 AM Medical Record Number: [id number] Patient Account Number: [id number] Date of Birth/Sex: 10/19/1965 (55 y.o. F) Treating RN: Yevonne Pax Primary Care January Bergthold: Aurora Mask Other Clinician: Referring Janeece Blok: Aurora Mask Treating Itzia Cunliffe/Extender: Rowan Blase in Treatment: 0 Active Problems Location of Pain Severity and Description of Pain Patient Has Paino No Site Locations Pain Management and Medication Current Pain Management: Electronic Signature(s) Signed: 06/15/2021 3:56:47 PM By: Yevonne Pax RN Entered By: Yevonne Pax on 06/11/2021 08:47:36 Julia Ashley ([id number]) -------------------------------------------------------------------------------- Patient/Caregiver Education Details Patient Name: Julia Ashley Date of Service: 06/11/2021 8:45 AM Medical Record Number: [id number] Patient Account Number: [id number] Date of Birth/Gender: Dec 01, 1965 (55 y.o. F) Treating RN: Yevonne Pax Primary Care Physician: Aurora Mask Other Clinician: Referring Physician: Aurora Mask Treating Physician/Extender: Rowan Blase in Treatment: 0 Education Assessment Education Provided To: Patient Education Topics Provided Wound/Skin Impairment: Methods: Explain/Verbal Responses: State content correctly Electronic Signature(s) Signed: 06/15/2021 3:56:47 PM By: Yevonne Pax RN Entered By: Yevonne Pax on 06/11/2021 09:34:19 Julia Ashley  ([id number]) -------------------------------------------------------------------------------- Wound Assessment Details Patient Name: Julia Ashley Date of Service: 06/11/2021 8:45 AM Medical Record Number: [id number] Patient Account Number: [id number] Date of Birth/Sex: Feb 02, 1966 (55 y.o. F) Treating RN: Yevonne Pax Primary Care Yvonna Brun: Aurora Mask Other Clinician: Referring Madsen Riddle: Aurora Mask Treating Shenetta Schnackenberg/Extender: Rowan Blase in Treatment: 0 Wound Status Wound Number: 1 Primary Etiology: Trauma, Other Wound Location: Right, Medial Lower Leg Wound Status: Open Wounding Event: Trauma Comorbid History: Hypertension Date Acquired: 05/30/2021 Weeks Of Treatment: 0 Clustered Wound: No Photos Wound Measurements Length: (cm) 6.5 Width: (cm) 9 Depth: (cm) 0.3 Area: (cm) 45.946 Volume: (cm) 13.784 % Reduction in Area: % Reduction in Volume: Epithelialization: None Tunneling: No Undermining: No Wound Description Classification: Full Thickness Without Exposed Support Structu Exudate Amount: Medium Exudate Type: Serosanguineous Exudate Color: red, brown res Foul Odor After Cleansing: No Slough/Fibrino Yes Wound Bed Granulation Amount: Medium (34-66%) Exposed Structure Granulation Quality: Red, Pink Fascia Exposed: No Necrotic Amount: Medium (34-66%) Fat Layer (Subcutaneous Tissue) Exposed: Yes Necrotic Quality: Adherent Slough Tendon Exposed: No Muscle Exposed: No Joint Exposed: No Bone Exposed: No Electronic Signature(s) Signed: 06/15/2021 3:56:47 PM By: Yevonne Pax RN Entered By: Yevonne Pax on 06/11/2021 09:05:03 Julia Ashley ([id number]) -------------------------------------------------------------------------------- Vitals Details Patient Name: Julia Ashley Date of Service: 06/11/2021 8:45 AM Medical Record Number: [id number] Patient Account Number: [id number] Date of Birth/Sex: July 17, 1966 (55 y.o. F) Treating RN: Yevonne Pax Primary Care Evann Koelzer: Aurora Mask Other Clinician: Referring Mayo Owczarzak: Aurora Mask Treating Srija Southard/Extender: Rowan Blase in Treatment: 0 Vital Signs Time Taken: 08:47 Temperature (F): 98.8 Height (in): 72 Pulse (bpm): 101 Source: Stated Respiratory Rate (breaths/min): 18 Weight (lbs): 277 Blood Pressure (mmHg): 163/109 Source: Stated Reference Range: 80 - 120 mg /  dl Body Mass Index (BMI): 37.6 Electronic Signature(s) Signed: 06/15/2021 3:56:47 PM By: Yevonne Pax RN Entered By: Yevonne Pax on 06/11/2021 08:48:30

## 2021-06-15 NOTE — Progress Notes (Signed)
HAIVEN, NARDONE ([id number]) Visit Report for 06/11/2021 Chief Complaint Document Details Patient Name: Julia Ashley, Julia Ashley Date of Service: 06/11/2021 8:45 AM Medical Record Number: [id number] Patient Account Number: [id number] Date of Birth/Sex: 1966/06/04 (55 y.o. F) Treating RN: Yevonne Pax Primary Care Provider: Aurora Mask Other Clinician: Referring Provider: Aurora Mask Treating Provider/Extender: Rowan Blase in Treatment: 0 Information Obtained from: Patient Chief Complaint Laceration right leg Electronic Signature(s) Signed: 06/11/2021 9:16:32 AM By: Lenda Kelp PA-C Entered By: Lenda Kelp on 06/11/2021 09:16:32 Julia Ashley ([id number]) -------------------------------------------------------------------------------- Debridement Details Patient Name: Julia Ashley Date of Service: 06/11/2021 8:45 AM Medical Record Number: [id number] Patient Account Number: [id number] Date of Birth/Sex: August 07, 1965 (55 y.o. F) Treating RN: Yevonne Pax Primary Care Provider: Aurora Mask Other Clinician: Referring Provider: Aurora Mask Treating Provider/Extender: Rowan Blase in Treatment: 0 Debridement Performed for Wound #1 Right,Medial Lower Leg Assessment: Performed By: Physician Nelida Meuse., PA-C Debridement Type: Debridement Level of Consciousness (Pre- Awake and Alert procedure): Pre-procedure Verification/Time Out Yes - 09:25 Taken: Start Time: 09:25 Total Area Debrided (L x W): 6.5 (cm) x 9 (cm) = 58.5 (cm) Tissue and other material Viable, Non-Viable, Blood Clots, Subcutaneous, Skin: Dermis , Skin: Epidermis debrided: Level: Skin/Subcutaneous Tissue Debridement Description: Excisional Instrument: Forceps, Scissors Bleeding: Minimum Hemostasis Achieved: Pressure End Time: 09:25 Procedural Pain: 0 Post Procedural Pain: 0 Response to Treatment: Procedure was tolerated well Level of Consciousness (Post- Awake and  Alert procedure): Post Debridement Measurements of Total Wound Length: (cm) 6.5 Width: (cm) 9 Depth: (cm) 0.3 Volume: (cm) 13.784 Character of Wound/Ulcer Post Debridement: Improved Post Procedure Diagnosis Same as Pre-procedure Electronic Signature(s) Signed: 06/11/2021 9:48:27 AM By: Lenda Kelp PA-C Signed: 06/15/2021 3:56:47 PM By: Yevonne Pax RN Entered By: Lenda Kelp on 06/11/2021 09:48:27 Julia Ashley ([id number]) -------------------------------------------------------------------------------- HPI Details Patient Name: Julia Ashley Date of Service: 06/11/2021 8:45 AM Medical Record Number: [id number] Patient Account Number: [id number] Date of Birth/Sex: [birth year] (55 y.o. F) Treating RN: Yevonne Pax Primary Care Provider: Aurora Mask Other Clinician: Referring Provider: Aurora Mask Treating Provider/Extender: Rowan Blase in Treatment: 0 History of Present Illness HPI Description: 06/11/2021 upon evaluation today patient presents for initial inspection here in the clinic concerning an issue that she had with a laceration on her right leg that occurred on May 30, 2021. This was when she was on a cruise they were actually in the [place] and they were on land off ship when this occurred she is not even sure what she hit on the scooter but nonetheless had a significant laceration. Subsequently her husband used a bungee cord to tourniquet the leg she tells me that it hurts severely. Nonetheless when she gets to the ship they actually did attempt to suture this back to the area that was sutured back appears to have survived the other half did not. Rating to remove the sutures today and then subsequently cleaned away any of the dead necrotic tissue. Fortunately I do not see any signs of active infection systemically at this point. That is great news overall locally I think that infection is definitely something that we will get a need to  consider here. Especially with the significant debridement like we are doing I will probably put her on a prophylactic antibiotic just to be on the safe side. I discussed that with the patient today as well. She has a history of hypertension but no other major medical problems. She does seem to bruise and bleed easily however. She has no history of  any hematologic conditions. Electronic Signature(s) Signed: 06/11/2021 9:49:46 AM By: Lenda Kelp PA-C Entered By: Lenda Kelp on 06/11/2021 09:49:46 [place], Misty Stanley ([id number]) -------------------------------------------------------------------------------- Physical Exam Details Patient Name: Julia Ashley Date of Service: 06/11/2021 8:45 AM Medical Record Number: [id number] Patient Account Number: [id number] Date of Birth/Sex: 09-16-1965 (55 y.o. F) Treating RN: Yevonne Pax Primary Care Provider: Aurora Mask Other Clinician: Referring Provider: Aurora Mask Treating Provider/Extender: Rowan Blase in Treatment: 0 Constitutional patient is hypertensive.. pulse regular and within target range for patient.Marland Kitchen respirations regular, non-labored and within target range for patient.Marland Kitchen temperature within target range for patient.. Well-nourished and well-hydrated in no acute distress. Eyes conjunctiva clear no eyelid edema noted. pupils equal round and reactive to light and accommodation. Ears, Nose, Mouth, and Throat no gross abnormality of ear auricles or external auditory canals. normal hearing noted during conversation. mucus membranes moist. Respiratory normal breathing without difficulty. Cardiovascular 2+ dorsalis pedis/posterior tibialis pulses. no clubbing, cyanosis, significant edema, <3 sec cap refill. Musculoskeletal normal gait and posture. no significant deformity or arthritic changes, no loss or range of motion, no clubbing. Psychiatric this patient is able to make decisions and demonstrates good insight  into disease process. Alert and Oriented x 3. pleasant and cooperative. Notes Upon inspection patient's wound bed actually showed signs of good granulation and epithelization in some areas she had some necrotic tissue including necrotic flap as well that was present and needed to be removed. The good news is the medial portion of the wound flap actually does appear to have pretty much reattached and done okay there was some blistering on the external sections of the wound but underneath and deeper and it appeared to be quite healthy which is good news. Nonetheless I did actually perform debridement to clear away the necrotic flap there was also a hematoma in the inferior portion of the wound that had to clear out with a #5 curette as well. Electronic Signature(s) Signed: 06/11/2021 9:50:30 AM By: Lenda Kelp PA-C Entered By: Lenda Kelp on 06/11/2021 09:50:30 [place], Misty Stanley ([id number]) -------------------------------------------------------------------------------- Physician Orders Details Patient Name: Julia Ashley Date of Service: 06/11/2021 8:45 AM Medical Record Number: [id number] Patient Account Number: [id number] Date of Birth/Sex: 1966-03-06 (55 y.o. F) Treating RN: Yevonne Pax Primary Care Provider: Aurora Mask Other Clinician: Referring Provider: Aurora Mask Treating Provider/Extender: Rowan Blase in Treatment: 0 Verbal / Phone Orders: No Diagnosis Coding ICD-10 Coding Code Description [phone] Laceration without foreign body, right lower leg, initial encounter L97.812 Non-pressure chronic ulcer of other part of right lower leg with fat layer exposed I10 Essential (primary) hypertension Follow-up Appointments o Return Appointment in 1 week. o Nurse Visit as needed - end of the week Bathing/ Shower/ Hygiene o May shower with wound dressing protected with water repellent cover or cast protector. Edema Control - Lymphedema / Segmental  Compressive Device / Other o Optional: One layer of unna paste to top of compression wrap (to act as an anchor). o Elevate, Exercise Daily and Avoid Standing for Long Periods of Time. o Elevate legs to the level of the heart and pump ankles as often as possible o Elevate leg(s) parallel to the floor when sitting. Wound Treatment Wound #1 - Lower Leg Wound Laterality: Right, Medial Cleanser: Normal Saline 2 x Per Week/30 Days Discharge Instructions: Wash your hands with soap and water. Remove old dressing, discard into plastic bag and place into trash. Cleanse the wound with Normal Saline prior to applying a clean dressing using gauze sponges, not  tissues or cotton balls. Do not scrub or use excessive force. Pat dry using gauze sponges, not tissue or cotton balls. Cleanser: Soap and Water 2 x Per Week/30 Days Discharge Instructions: Gently cleanse wound with antibacterial soap, rinse and pat dry prior to dressing wounds Primary Dressing: Hydrofera Blue Ready Transfer Foam, 4x5 (in/in) 2 x Per Week/30 Days Discharge Instructions: Apply Hydrofera Blue Ready to wound bed as directed Secondary Dressing: Zetuvit Plus Silicone Non-bordered 5x5 (in/in) 2 x Per Week/30 Days Compression Wrap: Medichoice 4 layer Compression System, 35-40 mmHG 2 x Per Week/30 Days Discharge Instructions: Apply multi-layer wrap as directed. Patient Medications Allergies: lisinopril, aspirin Notifications Medication Indication Start End Augmentin 06/11/2021 DOSE 1 - oral 875 mg-125 mg tablet - 1 tablet oral taken 2 times per day for 10 days Electronic Signature(s) Signed: 06/11/2021 4:18:05 PM By: Lenda Kelp PA-C Signed: 06/15/2021 3:56:47 PM By: Yevonne Pax RN Previous Signature: 06/11/2021 9:51:56 AM Version By: Lenda Kelp PA-C Entered By: Yevonne Pax on 06/11/2021 09:53:49 Julia Ashley ([id number]) [place], Jakelyn  ([id number]) -------------------------------------------------------------------------------- Problem List Details Patient Name: Julia Ashley Date of Service: 06/11/2021 8:45 AM Medical Record Number: [id number] Patient Account Number: [id number] Date of Birth/Sex: January 10, 1966 (55 y.o. F) Treating RN: Yevonne Pax Primary Care Provider: Aurora Mask Other Clinician: Referring Provider: Aurora Mask Treating Provider/Extender: Rowan Blase in Treatment: 0 Active Problems ICD-10 Encounter Code Description Active Date MDM Diagnosis S81.811A Laceration without foreign body, right lower leg, initial encounter 06/11/2021 No Yes L97.812 Non-pressure chronic ulcer of other part of right lower leg with fat layer 06/11/2021 No Yes exposed I10 Essential (primary) hypertension 06/11/2021 No Yes Inactive Problems Resolved Problems Electronic Signature(s) Signed: 06/11/2021 9:16:17 AM By: Lenda Kelp PA-C Entered By: Lenda Kelp on 06/11/2021 09:16:17 Julia Ashley ([id number]) -------------------------------------------------------------------------------- Progress Note Details Patient Name: Julia Ashley Date of Service: 06/11/2021 8:45 AM Medical Record Number: [id number] Patient Account Number: [id number] Date of Birth/Sex: 1966/07/08 (55 y.o. F) Treating RN: Yevonne Pax Primary Care Provider: Aurora Mask Other Clinician: Referring Provider: Aurora Mask Treating Provider/Extender: Rowan Blase in Treatment: 0 Subjective Chief Complaint Information obtained from Patient Laceration right leg History of Present Illness (HPI) 06/11/2021 upon evaluation today patient presents for initial inspection here in the clinic concerning an issue that she had with a laceration on her right leg that occurred on May 30, 2021. This was when she was on a cruise they were actually in the [place] and they were on land off ship when this occurred she is  not even sure what she hit on the scooter but nonetheless had a significant laceration. Subsequently her husband used a bungee cord to tourniquet the leg she tells me that it hurts severely. Nonetheless when she gets to the ship they actually did attempt to suture this back to the area that was sutured back appears to have survived the other half did not. Rating to remove the sutures today and then subsequently cleaned away any of the dead necrotic tissue. Fortunately I do not see any signs of active infection systemically at this point. That is great news overall locally I think that infection is definitely something that we will get a need to consider here. Especially with the significant debridement like we are doing I will probably put her on a prophylactic antibiotic just to be on the safe side. I discussed that with the patient today as well. She has a history of hypertension but no other major medical problems. She does seem to  bruise and bleed easily however. She has no history of any hematologic conditions. Patient History Information obtained from Patient. Allergies lisinopril, aspirin Social History Never smoker, Marital Status - Married, Alcohol Use - Rarely, Drug Use - No History, Caffeine Use - Daily. Medical History Cardiovascular Patient has history of Hypertension Review of Systems (ROS) Eyes Complains or has symptoms of Glasses / Contacts. Objective Constitutional patient is hypertensive.. pulse regular and within target range for patient.Marland Kitchen respirations regular, non-labored and within target range for patient.Marland Kitchen temperature within target range for patient.. Well-nourished and well-hydrated in no acute distress. Vitals Time Taken: 8:47 AM, Height: 72 in, Source: Stated, Weight: 277 lbs, Source: Stated, BMI: 37.6, Temperature: 98.8 F, Pulse: 101 bpm, Respiratory Rate: 18 breaths/min, Blood Pressure: 163/109 mmHg. Eyes conjunctiva clear no eyelid edema noted. pupils equal  round and reactive to light and accommodation. Ears, Nose, Mouth, and Throat no gross abnormality of ear auricles or external auditory canals. normal hearing noted during conversation. mucus membranes moist. Respiratory normal breathing without difficulty. Cardiovascular [place], [place] ([id number]) 2+ dorsalis pedis/posterior tibialis pulses. no clubbing, cyanosis, significant edema, Musculoskeletal normal gait and posture. no significant deformity or arthritic changes, no loss or range of motion, no clubbing. Psychiatric this patient is able to make decisions and demonstrates good insight into disease process. Alert and Oriented x 3. pleasant and cooperative. General Notes: Upon inspection patient's wound bed actually showed signs of good granulation and epithelization in some areas she had some necrotic tissue including necrotic flap as well that was present and needed to be removed. The good news is the medial portion of the wound flap actually does appear to have pretty much reattached and done okay there was some blistering on the external sections of the wound but underneath and deeper and it appeared to be quite healthy which is good news. Nonetheless I did actually perform debridement to clear away the necrotic flap there was also a hematoma in the inferior portion of the wound that had to clear out with a #5 curette as well. Integumentary (Hair, Skin) Wound #1 status is Open. Original cause of wound was Trauma. The date acquired was: 05/30/2021. The wound is located on the Right,Medial Lower Leg. The wound measures 6.5cm length x 9cm width x 0.3cm depth; 45.946cm^2 area and 13.784cm^3 volume. There is Fat Layer (Subcutaneous Tissue) exposed. There is no tunneling or undermining noted. There is a medium amount of serosanguineous drainage noted. There is medium (34-66%) red, pink granulation within the wound bed. There is a medium (34-66%) amount of necrotic tissue within the wound bed  including Adherent Slough. Assessment Active Problems ICD-10 Laceration without foreign body, right lower leg, initial encounter Non-pressure chronic ulcer of other part of right lower leg with fat layer exposed Essential (primary) hypertension Procedures Wound #1 Pre-procedure diagnosis of Wound #1 is a Trauma, Other located on the Right,Medial Lower Leg . There was a Excisional Skin/Subcutaneous Tissue Debridement with a total area of 58.5 sq cm performed by Nelida Meuse., PA-C. With the following instrument(s): Forceps, and Scissors to remove Viable and Non-Viable tissue/material. Material removed includes Blood Clots, Subcutaneous Tissue, Skin: Dermis, and Skin: Epidermis. No specimens were taken. A time out was conducted at 09:25, prior to the start of the procedure. A Minimum amount of bleeding was controlled with Pressure. The procedure was tolerated well with a pain level of 0 throughout and a pain level of 0 following the procedure. Post Debridement Measurements: 6.5cm length x 9cm width x 0.3cm depth; 13.784cm^3  volume. Character of Wound/Ulcer Post Debridement is improved. Post procedure Diagnosis Wound #1: Same as Pre-Procedure Plan Follow-up Appointments: Return Appointment in 1 week. Nurse Visit as needed - end of the week Bathing/ Shower/ Hygiene: May shower with wound dressing protected with water repellent cover or cast protector. Edema Control - Lymphedema / Segmental Compressive Device / Other: Elevate, Exercise Daily and Avoid Standing for Long Periods of Time. Elevate legs to the level of the heart and pump ankles as often as possible Elevate leg(s) parallel to the floor when sitting. The following medication(s) was prescribed: Augmentin oral 875 mg-125 mg tablet 1 1 tablet oral taken 2 times per day for 10 days starting 06/11/2021 WOUND #1: - Lower Leg Wound Laterality: Right, Medial Cleanser: Normal Saline Discharge Instructions: Wash your hands with soap and  water. Remove old dressing, discard into plastic bag and place into trash. Cleanse the wound with Normal Saline prior to applying a clean dressing using gauze sponges, not tissues or cotton balls. Do not scrub or use excessive force. Pat dry using gauze sponges, not tissue or cotton balls. Cleanser: Soap and Water [place], [place] ([id number]) Discharge Instructions: Gently cleanse wound with antibacterial soap, rinse and pat dry prior to dressing wounds Primary Dressing: Hydrofera Blue Ready Transfer Foam, 4x5 (in/in) Discharge Instructions: Apply Hydrofera Blue Ready to wound bed as directed Secondary Dressing: Zetuvit Plus Silicone Non-bordered 5x5 (in/in) Compression Wrap: Medichoice 4 layer Compression System, 35-40 mmHG Discharge Instructions: Apply multi-layer wrap as directed. 1. Post debridement the patient's wound bed actually did appear to be doing a little bit better which is great news this is still a significant wound is going to take some time to heal. I do think she would benefit from [employer] and I am going to get that started for her today. 2. I am also can recommend at this time that we have the patient use a Zetuvit dressing over top due to the fact I think she is getting drained quite a bit here. 3. I am also can recommend that we use a 4-layer compression wrap which I think will absolutely be appropriate as well to help with the edema control I think she probably does need to have a nurse visit on Wednesday. 4. I am going to send in a prescription as well for Augmentin just as a prophylactic and preventative measure considering the significant debridement today I do not want to risk this developed into an infection which I think is a possibility. The patient was in agreement with that plan. We will see patient back for reevaluation in 1 week here in the clinic. If anything worsens or changes patient will contact our office for additional recommendations. Electronic  Signature(s) Signed: 06/11/2021 9:52:08 AM By: Lenda Kelp PA-C Entered By: Lenda Kelp on 06/11/2021 09:52:07 Julia Ashley ([id number]) -------------------------------------------------------------------------------- ROS/PFSH Details Patient Name: Julia Ashley Date of Service: 06/11/2021 8:45 AM Medical Record Number: [id number] Patient Account Number: [id number] Date of Birth/Sex: 11-24-1965 (55 y.o. F) Treating RN: Yevonne Pax Primary Care Provider: Aurora Mask Other Clinician: Referring Provider: Aurora Mask Treating Provider/Extender: Rowan Blase in Treatment: 0 Information Obtained From Patient Eyes Complaints and Symptoms: Positive for: Glasses / Contacts Cardiovascular Medical History: Positive for: Hypertension Immunizations Pneumococcal Vaccine: Received Pneumococcal Vaccination: No Implantable Devices None Family and Social History Never smoker; Marital Status - Married; Alcohol Use: Rarely; Drug Use: No History; Caffeine Use: Daily; Financial Concerns: No; Food, Clothing or Shelter Needs: No; Support System Lacking: No; Transportation Concerns:  No Electronic Signature(s) Signed: 06/11/2021 4:18:05 PM By: Lenda Kelp PA-C Signed: 06/15/2021 3:56:47 PM By: Yevonne Pax RN Entered By: Yevonne Pax on 06/11/2021 08:51:49 Julia Ashley ([id number]) -------------------------------------------------------------------------------- SuperBill Details Patient Name: Julia Ashley Date of Service: 06/11/2021 Medical Record Number: [id number] Patient Account Number: [id number] Date of Birth/Sex: [birth year] (55 y.o. F) Treating RN: Yevonne Pax Primary Care Provider: Aurora Mask Other Clinician: Referring Provider: Aurora Mask Treating Provider/Extender: Rowan Blase in Treatment: 0 Diagnosis Coding ICD-10 Codes Code Description [phone] Laceration without foreign body, right lower leg, initial  encounter L97.812 Non-pressure chronic ulcer of other part of right lower leg with fat layer exposed I10 Essential (primary) hypertension Facility Procedures CPT4 Code: [id number] Description: 99213 - WOUND CARE VISIT-LEV 3 EST PT Modifier: Quantity: 1 CPT4 Code: 29518841 Description: 11042 - DEB SUBQ TISSUE 20 SQ CM/< Modifier: Quantity: 1 CPT4 Code: Description: ICD-10 Diagnosis Description S81.811A Laceration without foreign body, right lower leg, initial encounter L97.812 Non-pressure chronic ulcer of other part of right lower leg with fat laye Modifier: r exposed Quantity: CPT4 Code: 66063016 Description: 11045 - DEB SUBQ TISS EA ADDL 20CM Modifier: Quantity: 2 CPT4 Code: Description: ICD-10 Diagnosis Description S81.811A Laceration without foreign body, right lower leg, initial encounter L97.812 Non-pressure chronic ulcer of other part of right lower leg with fat laye Modifier: r exposed Quantity: Physician Procedures CPT4 Code: 0109323 Description: 99214 - WC PHYS LEVEL 4 - EST PT Modifier: 25 Quantity: 1 CPT4 Code: Description: ICD-10 Diagnosis Description S81.811A Laceration without foreign body, right lower leg, initial encounter L97.812 Non-pressure chronic ulcer of other part of right lower leg with fat laye I10 Essential (primary) hypertension Modifier: r exposed Quantity: CPT4 Code: 5573220 Description: 11042 - WC PHYS SUBQ TISS 20 SQ CM Modifier: Quantity: 1 CPT4 Code: Description: ICD-10 Diagnosis Description S81.811A Laceration without foreign body, right lower leg, initial encounter L97.812 Non-pressure chronic ulcer of other part of right lower leg with fat laye Modifier: r exposed Quantity: CPT4 Code: 2542706 Description: 11045 - WC PHYS SUBQ TISS EA ADDL 20 CM Modifier: Quantity: 2 CPT4 Code: Description: ICD-10 Diagnosis Description S81.811A Laceration without foreign body, right lower leg, initial encounter L97.812 Non-pressure chronic ulcer of other  part of right lower leg with fat laye Modifier: r exposed Quantity: Electronic Signature(s) Signed: 06/11/2021 9:52:47 AM By: Lenda Kelp PA-C Entered By: Lenda Kelp on 06/11/2021 09:52:46

## 2021-06-19 ENCOUNTER — Other Ambulatory Visit: Payer: Self-pay

## 2021-06-19 ENCOUNTER — Encounter: Payer: BC Managed Care – PPO | Admitting: Physician Assistant

## 2021-06-19 DIAGNOSIS — X58XXXA Exposure to other specified factors, initial encounter: Secondary | ICD-10-CM | POA: Diagnosis not present

## 2021-06-19 DIAGNOSIS — S81811A Laceration without foreign body, right lower leg, initial encounter: Secondary | ICD-10-CM | POA: Diagnosis not present

## 2021-06-19 DIAGNOSIS — L97812 Non-pressure chronic ulcer of other part of right lower leg with fat layer exposed: Secondary | ICD-10-CM | POA: Diagnosis not present

## 2021-06-19 DIAGNOSIS — I10 Essential (primary) hypertension: Secondary | ICD-10-CM | POA: Diagnosis not present

## 2021-06-19 NOTE — Progress Notes (Signed)
SANYAH, MOLNAR ([id number]) Visit Report for 06/19/2021 Arrival Information Details Patient Name: Julia Ashley, Julia Ashley Date of Service: 06/19/2021 11:30 AM Medical Record Number: [id number] Patient Account Number: [id number] Date of Birth/Sex: 1966-03-31 (55 y.o. F) Treating RN: Donnamarie Poag Primary Care Fatumata Kashani: Gemma Payor Other Clinician: Referring Grethel Zenk: Gemma Payor Treating Breeanne Oblinger/Extender: Skipper Cliche in Treatment: 1 Visit Information History Since Last Visit Added or deleted any medications: No Patient Arrived: Ambulatory Had a fall or experienced change in No Arrival Time: 11:47 activities of daily living that may affect Accompanied By: self risk of falls: Transfer Assistance: None Hospitalized since last visit: No Patient Requires Transmission-Based Precautions: No Has Dressing in Place as Prescribed: Yes Patient Has Alerts: No Has Compression in Place as Prescribed: Yes Pain Present Now: No Electronic Signature(s) Signed: 06/19/2021 3:16:58 PM By: Donnamarie Poag Entered By: Donnamarie Poag on 06/19/2021 11:49:53 Julia Ashley ([id number]) -------------------------------------------------------------------------------- Clinic Level of Care Assessment Details Patient Name: Julia Ashley Date of Service: 06/19/2021 11:30 AM Medical Record Number: [id number] Patient Account Number: [id number] Date of Birth/Sex: 01/14/1966 (55 y.o. F) Treating RN: Donnamarie Poag Primary Care Laurita Peron: Gemma Payor Other Clinician: Referring Eilah Common: Gemma Payor Treating Deonna Krummel/Extender: Skipper Cliche in Treatment: 1 Clinic Level of Care Assessment Items TOOL 1 Quantity Score _0  - Use when EandM and Procedure is performed on INITIAL visit 0 ASSESSMENTS - Nursing Assessment / Reassessment _1  - General Physical Exam (combine w/ comprehensive assessment (listed just below) when performed on new 0 pt. evals) _2  - 0 Comprehensive Assessment (HX,  ROS, Risk Assessments, Wounds Hx, etc.) ASSESSMENTS - Wound and Skin Assessment / Reassessment _3  - Dermatologic / Skin Assessment (not related to wound area) 0 ASSESSMENTS - Ostomy and/or Continence Assessment and Care _4  - Incontinence Assessment and Management 0 _5  - 0 Ostomy Care Assessment and Management (repouching, etc.) PROCESS - Coordination of Care _6  - Simple Patient / Family Education for ongoing care 0 _7  - 0 Complex (extensive) Patient / Family Education for ongoing care _8  - 0 Staff obtains Programmer, systems, Records, Test Results / Process Orders _9  - 0 Staff telephones HHA, Nursing Homes / Clarify orders / etc _10  - 0 Routine Transfer to another Facility (non-emergent condition) _11  - 0 Routine Hospital Admission (non-emergent condition) _12  - 0 New Admissions / Biomedical engineer / Ordering NPWT, Apligraf, etc. _13  - 0 Emergency Hospital Admission (emergent condition) PROCESS - Special Needs _14  - Pediatric / Minor Patient Management 0 _15  - 0 Isolation Patient Management _16  - 0 Hearing / Language / Visual special needs _17  - 0 Assessment of Community assistance (transportation, D/C planning, etc.) _18  - 0 Additional assistance / Altered mentation _19  - 0 Support Surface(s) Assessment (bed, cushion, seat, etc.) INTERVENTIONS - Miscellaneous _20  - External ear exam 0 _21  - 0 Patient Transfer (multiple staff / Civil Service fast streamer / Similar devices) _22  - 0 Simple Staple / Suture removal (25 or less) _23  - 0 Complex Staple / Suture removal (26 or more) _24  - 0 Hypo/Hyperglycemic Management (do not check if billed separately) _25  - 0 Ankle / Brachial Index (ABI) - do not check if billed separately Has the patient been seen at the hospital within the last three years: Yes Total Score: 0 Level Of Care: ____ Julia Ashley ([id number]) Electronic Signature(s) Signed: 06/19/2021 3:16:58 PM By: Donnamarie Poag Entered By: Donnamarie Poag on 06/19/2021 12:33:36 Julia Ashley  ([id number]) -------------------------------------------------------------------------------- Compression Therapy Details Patient Name: Julia Ashley Date of Service: 06/19/2021 11:30 AM Medical Record Number: [id number] Patient Account Number: [id number] Date of Birth/Sex:  Sep 14, 1965 (55 y.o. F) Treating RN: Donnamarie Poag Primary Care Jackston [place]: Gemma Payor Other Clinician: Referring Jahzaria Vary: Gemma Payor Treating Kaleab Frasier/Extender: Skipper Cliche in Treatment: 1 Compression Therapy Performed for Wound Assessment: Wound #1 Right,Medial Lower Leg Performed By: Junius Argyle, RN Compression Type: Four Layer Post Procedure Diagnosis Same as Pre-procedure Electronic Signature(s) Signed: 06/19/2021 3:16:58 PM By: Donnamarie Poag Entered By: Donnamarie Poag on 06/19/2021 12:16:26 Julia Ashley ([id number]) -------------------------------------------------------------------------------- Encounter Discharge Information Details Patient Name: Julia Ashley Date of Service: 06/19/2021 11:30 AM Medical Record Number: [id number] Patient Account Number: [id number] Date of Birth/Sex: January 17, 1966 (55 y.o. F) Treating RN: Donnamarie Poag Primary Care Quintrell Baze: Gemma Payor Other Clinician: Referring Willia Genrich: Gemma Payor Treating Jinna Weinman/Extender: Skipper Cliche in Treatment: 1 Encounter Discharge Information Items Post Procedure Vitals Discharge Condition: Stable Temperature (F): 98.1 Ambulatory Status: Ambulatory Pulse (bpm): 93 Discharge Destination: Home Respiratory Rate (breaths/min): 16 Transportation: Private Auto Blood Pressure (mmHg): 162/93 Accompanied By: self Schedule Follow-up Appointment: Yes Clinical Summary of Care: Electronic Signature(s) Signed: 06/19/2021 3:16:58 PM By: Donnamarie Poag Entered By: Donnamarie Poag on 06/19/2021 12:34:10 Julia Ashley  ([id number]) -------------------------------------------------------------------------------- Lower Extremity Assessment Details Patient Name: Julia Ashley Date of Service: 06/19/2021 11:30 AM Medical Record Number: [id number] Patient Account Number: [id number] Date of Birth/Sex: [birth year] (55 y.o. F) Treating RN: Donnamarie Poag Primary Care Adrion Menz: Gemma Payor Other Clinician: Referring Shimeka Bacot: Gemma Payor Treating Darryl Willner/Extender: Skipper Cliche in Treatment: 1 Edema Assessment Assessed: [Left: No] [Right: Yes] [Left: Edema] [Right: :] Calf Left: Right: Point of Measurement: 34 cm From Medial Instep 46 cm Ankle Left: Right: Point of Measurement: 11 cm From Medial Instep 28.5 cm Knee To Floor Left: Right: From Medial Instep 47 cm Vascular Assessment Pulses: Dorsalis Pedis Palpable: [Right:Yes] Electronic Signature(s) Signed: 06/19/2021 3:16:58 PM By: Donnamarie Poag Entered By: Donnamarie Poag on 06/19/2021 12:01:21 Julia Ashley ([id number]) -------------------------------------------------------------------------------- Multi Wound Chart Details Patient Name: Julia Ashley Date of Service: 06/19/2021 11:30 AM Medical Record Number: [id number] Patient Account Number: [id number] Date of Birth/Sex: 10-24-1965 (55 y.o. F) Treating RN: Donnamarie Poag Primary Care Milbern Doescher: Gemma Payor Other Clinician: Referring Malesha Suliman: Gemma Payor Treating Jonanthan Bolender/Extender: Skipper Cliche in Treatment: 1 Vital Signs Height(in): 72 Pulse(bpm): 93 Weight(lbs): 277 Blood Pressure(mmHg): 162/93 Body Mass Index(BMI): 38 Temperature(F): 98.1 Respiratory Rate(breaths/min): 16 Photos: [N/A:N/A] Wound Location: Right, Medial Lower Leg N/A N/A Wounding Event: Trauma N/A N/A Primary Etiology: Trauma, Other N/A N/A Comorbid History: Hypertension N/A N/A Date Acquired: 05/30/2021 N/A N/A Weeks of Treatment: 1 N/A N/A Wound Status: Open N/A  N/A Measurements L x W x D (cm) 4.4x7.4x1 N/A N/A Area (cm) : 25.573 N/A N/A Volume (cm) : 25.573 N/A N/A % Reduction in Area: 44.30% N/A N/A % Reduction in Volume: -85.50% N/A N/A Position 1 (o'clock): 6 Maximum Distance 1 (cm): 1.5 Tunneling: Yes N/A N/A Classification: Full Thickness Without Exposed N/A N/A Support Structures Exudate Amount: Medium N/A N/A Exudate Type: Serosanguineous N/A N/A Exudate Color: red, brown N/A N/A Granulation Amount: Large (67-100%) N/A N/A Granulation Quality: Red, Pink N/A N/A Necrotic Amount: Small (1-33%) N/A N/A Exposed Structures: Fat Layer (Subcutaneous Tissue): N/A N/A Yes Fascia: No Tendon: No Muscle: No Joint: No Bone: No Epithelialization: None N/A N/A Treatment Notes Electronic Signature(s) Signed: 06/19/2021 3:16:58 PM By: Donnamarie Poag Entered By: Donnamarie Poag on 06/19/2021 12:01:54 Julia Ashley ([id number]) -------------------------------------------------------------------------------- Multi-Disciplinary Care Plan Details Patient Name: Julia Ashley Date of Service: 06/19/2021 11:30 AM Medical Record Number: [id number] Patient Account Number: [id number] Date of Birth/Sex: June 28, 1966 (55 y.o. F) Treating RN:  Donnamarie Poag Primary Care Keandre Linden: Gemma Payor Other Clinician: Referring Lou Loewe: Gemma Payor Treating Syrianna Schillaci/Extender: Skipper Cliche in Treatment: 1 Active Inactive Wound/Skin Impairment Nursing Diagnoses: Knowledge deficit related to ulceration/compromised skin integrity Goals: Patient/caregiver will verbalize understanding of skin care regimen Date Initiated: 06/11/2021 Date Inactivated: 06/19/2021 Target Resolution Date: 07/11/2021 Goal Status: Met Ulcer/skin breakdown will have a volume reduction of 30% by week 4 Date Initiated: 06/11/2021 Target Resolution Date: 08/11/2021 Goal Status: Active Ulcer/skin breakdown will have a volume reduction of 50% by week 8 Date Initiated:  06/11/2021 Target Resolution Date: 09/11/2021 Goal Status: Active Ulcer/skin breakdown will have a volume reduction of 80% by week 12 Date Initiated: 06/11/2021 Target Resolution Date: 10/09/2021 Goal Status: Active Ulcer/skin breakdown will heal within 14 weeks Date Initiated: 06/11/2021 Target Resolution Date: 11/09/2021 Goal Status: Active Interventions: Assess patient/caregiver ability to obtain necessary supplies Assess patient/caregiver ability to perform ulcer/skin care regimen upon admission and as needed Assess ulceration(s) every visit Notes: Electronic Signature(s) Signed: 06/19/2021 3:16:58 PM By: Donnamarie Poag Entered By: Donnamarie Poag on 06/19/2021 12:01:41 Julia Ashley ([id number]) -------------------------------------------------------------------------------- Pain Assessment Details Patient Name: Julia Ashley Date of Service: 06/19/2021 11:30 AM Medical Record Number: [id number] Patient Account Number: [id number] Date of Birth/Sex: [birth year] (55 y.o. F) Treating RN: Donnamarie Poag Primary Care Lorynn Moeser: Gemma Payor Other Clinician: Referring Twala Collings: Gemma Payor Treating Shealeigh Dunstan/Extender: Skipper Cliche in Treatment: 1 Active Problems Location of Pain Severity and Description of Pain Patient Has Paino No Site Locations Rate the pain. Current Pain Level: 0 Pain Management and Medication Current Pain Management: Electronic Signature(s) Signed: 06/19/2021 3:16:58 PM By: Donnamarie Poag Entered By: Donnamarie Poag on 06/19/2021 11:57:00 Julia Ashley ([id number]) -------------------------------------------------------------------------------- Patient/Caregiver Education Details Patient Name: Julia Ashley Date of Service: 06/19/2021 11:30 AM Medical Record Number: [id number] Patient Account Number: [id number] Date of Birth/Gender: [birth year] (55 y.o. F) Treating RN: Donnamarie Poag Primary Care Physician: Gemma Payor Other  Clinician: Referring Physician: Gemma Payor Treating Physician/Extender: Skipper Cliche in Treatment: 1 Education Assessment Education Provided To: Patient Education Topics Provided Wound/Skin Impairment: Electronic Signature(s) Signed: 06/19/2021 3:16:58 PM By: Donnamarie Poag Entered By: Donnamarie Poag on 06/19/2021 12:15:02 Julia Ashley ([id number]) -------------------------------------------------------------------------------- Wound Assessment Details Patient Name: Julia Ashley Date of Service: 06/19/2021 11:30 AM Medical Record Number: [id number] Patient Account Number: [id number] Date of Birth/Sex: 04-Jun-1966 (55 y.o. F) Treating RN: Donnamarie Poag Primary Care Fraser Busche: Gemma Payor Other Clinician: Referring Michaiah Holsopple: Gemma Payor Treating Ottis Vacha/Extender: Skipper Cliche in Treatment: 1 Wound Status Wound Number: 1 Primary Etiology: Trauma, Other Wound Location: Right, Medial Lower Leg Wound Status: Open Wounding Event: Trauma Comorbid History: Hypertension Date Acquired: 05/30/2021 Weeks Of Treatment: 1 Clustered Wound: No Photos Wound Measurements Length: (cm) 4.4 Width: (cm) 7.4 Depth: (cm) 1 Area: (cm) 25.573 Volume: (cm) 25.573 % Reduction in Area: 44.3% % Reduction in Volume: -85.5% Epithelialization: None Tunneling: Yes Position (o'clock): 11 Maximum Distance: (cm) 1.5 Undermining: No Wound Description Classification: Full Thickness Without Exposed Support Structu Exudate Amount: Medium Exudate Type: Serosanguineous Exudate Color: red, brown res Foul Odor After Cleansing: No Slough/Fibrino Yes Wound Bed Granulation Amount: Large (67-100%) Exposed Structure Granulation Quality: Red, Pink Fascia Exposed: No Necrotic Amount: Small (1-33%) Fat Layer (Subcutaneous Tissue) Exposed: Yes Necrotic Quality: Adherent Slough Tendon Exposed: No Muscle Exposed: No Joint Exposed: No Bone Exposed: No Electronic  Signature(s) Signed: 06/19/2021 3:16:58 PM By: Donnamarie Poag Entered By: Donnamarie Poag on 06/19/2021 12:14:37 Julia Ashley ([id number]) -------------------------------------------------------------------------------- [place] Details Patient Name: Julia Ashley Date of Service: 06/19/2021 11:30 AM Medical Record Number:  [id number] Patient Account Number: [id number] Date of Birth/Sex: May 18, 1966 (55 y.o. F) Treating RN: Donnamarie Poag Primary Care Linsay Vogt: Gemma Payor Other Clinician: Referring Roshard Rezabek: Gemma Payor Treating Eliah Ozawa/Extender: Skipper Cliche in Treatment: 1 Vital Signs Time Taken: 11:55 Temperature (F): 98.1 Height (in): 72 Pulse (bpm): 93 Weight (lbs): 277 Respiratory Rate (breaths/min): 16 Body Mass Index (BMI): 37.6 Blood Pressure (mmHg): 162/93 Reference Range: 80 - 120 mg / dl Electronic Signature(s) Signed: 06/19/2021 3:16:58 PM By: Donnamarie Poag Entered ByDonnamarie Poag on 06/19/2021 11:56:47

## 2021-06-19 NOTE — Progress Notes (Signed)
BAILIE, CHRISTEN[place] ([id number]) Visit Report for 06/19/2021 Chief Complaint Document Details Patient Name: Julia Ashley, Julia Ashley Date of Service: 06/19/2021 11:30 AM Medical Record Number: [id number] Patient Account Number: [id number] Date of Birth/Sex: 09-19-1965 (55 y.o. F) Treating RN: Hansel Feinstein Primary Care Provider: Aurora Mask Other Clinician: Referring Provider: Aurora Mask Treating Provider/Extender: Rowan Blase in Treatment: 1 Information Obtained from: Patient Chief Complaint Laceration right leg Electronic Signature(s) Signed: 06/19/2021 12:13:28 PM By: Lenda Kelp PA-C Entered By: Lenda Kelp on 06/19/2021 12:13:27 Julia Ashley ([id number]) -------------------------------------------------------------------------------- Debridement Details Patient Name: Julia Ashley Date of Service: 06/19/2021 11:30 AM Medical Record Number: [id number] Patient Account Number: [id number] Date of Birth/Sex: 10/06/1965 (55 y.o. F) Treating RN: Hansel Feinstein Primary Care Provider: Aurora Mask Other Clinician: Referring Provider: Aurora Mask Treating Provider/Extender: Rowan Blase in Treatment: 1 Debridement Performed for Wound #1 Right,Medial Lower Leg Assessment: Performed By: Physician Nelida Meuse., PA-C Debridement Type: Debridement Level of Consciousness (Pre- Awake and Alert procedure): Pre-procedure Verification/Time Out Yes - 12:15 Taken: Start Time: 12:16 Pain Control: Lidocaine Total Area Debrided (L x W): 1 (cm) x 1 (cm) = 1 (cm) Tissue and other material Viable, Non-Viable, Slough, Subcutaneous, Slough debrided: Level: Skin/Subcutaneous Tissue Debridement Description: Excisional Instrument: Curette Bleeding: Moderate Hemostasis Achieved: Pressure Response to Treatment: Procedure was tolerated well Level of Consciousness (Post- Awake and Alert procedure): Post Debridement Measurements of Total Wound Length:  (cm) 4.4 Width: (cm) 7.4 Depth: (cm) 1 Volume: (cm) 25.573 Character of Wound/Ulcer Post Debridement: Improved Post Procedure Diagnosis Same as Pre-procedure Electronic Signature(s) Unsigned Entered ByHansel Feinstein on 06/19/2021 12:19:07 Signature(s): Date(s): Julia Ashley ([id number]) -------------------------------------------------------------------------------- Physician Orders Details Patient Name: Julia Ashley Date of Service: 06/19/2021 11:30 AM Medical Record Number: [id number] Patient Account Number: [id number] Date of Birth/Sex: 06-19-1966 (55 y.o. F) Treating RN: Hansel Feinstein Primary Care Provider: Aurora Mask Other Clinician: Referring Provider: Aurora Mask Treating Provider/Extender: Rowan Blase in Treatment: 1 Verbal / Phone Orders: No Diagnosis Coding ICD-10 Coding Code Description [phone] Laceration without foreign body, right lower leg, initial encounter L97.812 Non-pressure chronic ulcer of other part of right lower leg with fat layer exposed I10 Essential (primary) hypertension Follow-up Appointments o Return Appointment in 1 week. - provider once a week (change wrap twice weekly) o Nurse Visit as needed - once a week (change wrap twice weekly) Bathing/ Shower/ Hygiene o May shower with wound dressing protected with water repellent cover or cast protector. o No tub bath. Edema Control - Lymphedema / Segmental Compressive Device / Other o Optional: One layer of unna paste to top of compression wrap (to act as an anchor). o Elevate, Exercise Daily and Avoid Standing for Long Periods of Time. o Elevate legs to the level of the heart and pump ankles as often as possible o Elevate leg(s) parallel to the floor when sitting. Additional Orders / Instructions o Follow Nutritious Diet and Increase Protein Intake Medications-Please add to medication list. o P.O. Antibiotics - take as prescribed until finished Wound  Treatment Wound #1 - Lower Leg Wound Laterality: Right, Medial Cleanser: Normal Saline 2 x Per Week/30 Days Discharge Instructions: Wash your hands with soap and water. Remove old dressing, discard into plastic bag and place into trash. Cleanse the wound with Normal Saline prior to applying a clean dressing using gauze sponges, not tissues or cotton balls. Do not scrub or use excessive force. Pat dry using gauze sponges, not tissue or cotton balls. Cleanser: Soap and Water 2 x Per Week/30 Days Discharge Instructions:  Gently cleanse wound with antibacterial soap, rinse and pat dry prior to dressing wounds Primary Dressing: Hydrofera Blue Ready Transfer Foam, 4x5 (in/in) 2 x Per Week/30 Days Discharge Instructions: Cut thinner and insert into tunnels-Apply Hydrofera Blue Ready to wound bed as directed Secondary Dressing: Zetuvit Plus Silicone Non-bordered 5x5 (in/in) 2 x Per Week/30 Days Compression Wrap: Medichoice 4 layer Compression System, 35-40 mmHG 2 x Per Week/30 Days Discharge Instructions: Apply multi-layer wrap as directed. Electronic Signature(s) Unsigned Entered ByHansel Feinstein on 06/19/2021 12:24:26 Signature(s): Date(s): CHARITIE, HINOTE ([id number]) -------------------------------------------------------------------------------- Problem List Details Patient Name: Julia, Ashley Date of Service: 06/19/2021 11:30 AM Medical Record Number: [id number] Patient Account Number: [id number] Date of Birth/Sex: 04/05/1966 (55 y.o. F) Treating RN: Hansel Feinstein Primary Care Provider: Aurora Mask Other Clinician: Referring Provider: Aurora Mask Treating Provider/Extender: Rowan Blase in Treatment: 1 Active Problems ICD-10 Encounter Code Description Active Date MDM Diagnosis S81.811A Laceration without foreign body, right lower leg, initial encounter 06/11/2021 No Yes L97.812 Non-pressure chronic ulcer of other part of right lower leg with fat layer 06/11/2021 No  Yes exposed I10 Essential (primary) hypertension 06/11/2021 No Yes Inactive Problems Resolved Problems Electronic Signature(s) Signed: 06/19/2021 12:13:18 PM By: Lenda Kelp PA-C Entered By: Lenda Kelp on 06/19/2021 12:13:18 Julia Ashley ([id number]) -------------------------------------------------------------------------------- [place] Details Patient Name: Julia Ashley Date of Service: 06/19/2021 Medical Record Number: [id number] Patient Account Number: [id number] Date of Birth/Sex: 27-Jun-1966 (55 y.o. F) Treating RN: Hansel Feinstein Primary Care Provider: Aurora Mask Other Clinician: Referring Provider: Aurora Mask Treating Provider/Extender: Rowan Blase in Treatment: 1 Diagnosis Coding ICD-10 Codes Code Description [phone] Laceration without foreign body, right lower leg, initial encounter L97.812 Non-pressure chronic ulcer of other part of right lower leg with fat layer exposed I10 Essential (primary) hypertension Facility Procedures CPT4 Code: [id number] Description: 11042 - DEB SUBQ TISSUE 20 SQ CM/< Modifier: Quantity: 1 CPT4 Code: Description: ICD-10 Diagnosis Description L97.812 Non-pressure chronic ulcer of other part of right lower leg with fat lay Modifier: er exposed Quantity: Physician Procedures CPT4 Code: 1761607 Description: 11042 - WC PHYS SUBQ TISS 20 SQ CM Modifier: Quantity: 1 CPT4 Code: Description: ICD-10 Diagnosis Description L97.812 Non-pressure chronic ulcer of other part of right lower leg with fat lay Modifier: er exposed Quantity: Electronic Signature(s) Unsigned Entered ByHansel Feinstein on 06/19/2021 12:20:48 Signature(s): Date(s):

## 2021-06-22 ENCOUNTER — Other Ambulatory Visit: Payer: Self-pay

## 2021-06-22 DIAGNOSIS — I10 Essential (primary) hypertension: Secondary | ICD-10-CM | POA: Diagnosis not present

## 2021-06-22 DIAGNOSIS — X58XXXA Exposure to other specified factors, initial encounter: Secondary | ICD-10-CM | POA: Diagnosis not present

## 2021-06-22 DIAGNOSIS — S81811A Laceration without foreign body, right lower leg, initial encounter: Secondary | ICD-10-CM | POA: Diagnosis not present

## 2021-06-22 DIAGNOSIS — L97812 Non-pressure chronic ulcer of other part of right lower leg with fat layer exposed: Secondary | ICD-10-CM | POA: Diagnosis not present

## 2021-06-22 NOTE — Progress Notes (Signed)
TALESHIA, LUFF ([id number]) Visit Report for 06/22/2021 Arrival Information Details Patient Name: Julia Ashley, Julia Ashley Date of Service: 06/22/2021 10:15 AM Medical Record Number: [id number] Patient Account Number: [id number] Date of Birth/Sex: [birth year] (55 y.o. F) Treating RN: Yevonne Pax Primary Care Ilario Dhaliwal: Aurora Mask Other Clinician: Referring Mertis Mosher: Aurora Mask Treating Talley Casco/Extender: Rowan Blase in Treatment: 1 Visit Information History Since Last Visit All ordered tests and consults were completed: No Patient Arrived: Ambulatory Added or deleted any medications: No Arrival Time: 10:15 Any new allergies or adverse reactions: No Accompanied By: self Had a fall or experienced change in No Transfer Assistance: None activities of daily living that may affect Patient Identification Verified: Yes risk of falls: Secondary Verification Process Completed: Yes Signs or symptoms of abuse/neglect since last visito No Patient Requires Transmission-Based Precautions: No Hospitalized since last visit: No Patient Has Alerts: No Implantable device outside of the clinic excluding No cellular tissue based products placed in the center since last visit: Has Dressing in Place as Prescribed: Yes Has Compression in Place as Prescribed: Yes Pain Present Now: No Electronic Signature(s) Signed: 06/22/2021 10:45:48 AM By: Yevonne Pax RN Entered By: Yevonne Pax on 06/22/2021 10:45:48 Julia Ashley ([id number]) -------------------------------------------------------------------------------- Clinic Level of Care Assessment Details Patient Name: Julia Ashley Date of Service: 06/22/2021 10:15 AM Medical Record Number: [id number] Patient Account Number: [id number] Date of Birth/Sex: May 04, 1966 (55 y.o. F) Treating RN: Yevonne Pax Primary Care Leanora Murin: Aurora Mask Other Clinician: Referring Jovonte Commins: Aurora Mask Treating Jd Mccaster/Extender:  Rowan Blase in Treatment: 1 Clinic Level of Care Assessment Items TOOL 4 Quantity Score []  - Use when only an EandM is performed on FOLLOW-UP visit 0 ASSESSMENTS - Nursing Assessment / Reassessment []  - Reassessment of Co-morbidities (includes updates in patient status) 0 []  - 0 Reassessment of Adherence to Treatment Plan ASSESSMENTS - Wound and Skin Assessment / Reassessment []  - Simple Wound Assessment / Reassessment - one wound 0 []  - 0 Complex Wound Assessment / Reassessment - multiple wounds []  - 0 Dermatologic / Skin Assessment (not related to wound area) ASSESSMENTS - Focused Assessment []  - Circumferential Edema Measurements - multi extremities 0 []  - 0 Nutritional Assessment / Counseling / Intervention []  - 0 Lower Extremity Assessment (monofilament, tuning fork, pulses) []  - 0 Peripheral Arterial Disease Assessment (using hand held doppler) ASSESSMENTS - Ostomy and/or Continence Assessment and Care []  - Incontinence Assessment and Management 0 []  - 0 Ostomy Care Assessment and Management (repouching, etc.) PROCESS - Coordination of Care []  - Simple Patient / Family Education for ongoing care 0 []  - 0 Complex (extensive) Patient / Family Education for ongoing care []  - 0 Staff obtains , Records, Test Results / Process Orders []  - 0 Staff telephones HHA, Nursing Homes / Clarify orders / etc []  - 0 Routine Transfer to another Facility (non-emergent condition) []  - 0 Routine Hospital Admission (non-emergent condition) []  - 0 New Admissions / / Ordering NPWT, Apligraf, etc. []  - 0 Emergency Hospital Admission (emergent condition) []  - 0 Simple Discharge Coordination []  - 0 Complex (extensive) Discharge Coordination PROCESS - Special Needs []  - Pediatric / Minor Patient Management 0 []  - 0 Isolation Patient Management []  - 0 Hearing / Language / Visual special needs []  - 0 Assessment of Community assistance  (transportation, D/C planning, etc.) []  - 0 Additional assistance / Altered mentation []  - 0 Support Surface(s) Assessment (bed, cushion, seat, etc.) INTERVENTIONS - Wound Cleansing / Measurement [place], [place] ( ) []  - 0 Simple Wound Cleansing - one wound []  -  0 Complex Wound Cleansing - multiple wounds []  - 0 Wound Imaging (photographs - any number of wounds) []  - 0 Wound Tracing (instead of photographs) []  - 0 Simple Wound Measurement - one wound []  - 0 Complex Wound Measurement - multiple wounds INTERVENTIONS - Wound Dressings []  - Small Wound Dressing one or multiple wounds 0 []  - 0 Medium Wound Dressing one or multiple wounds []  - 0 Large Wound Dressing one or multiple wounds []  - 0 Application of Medications - topical []  - 0 Application of Medications - injection INTERVENTIONS - Miscellaneous []  - External ear exam 0 []  - 0 Specimen Collection (cultures, biopsies, blood, body fluids, etc.) []  - 0 Specimen(s) / Culture(s) sent or taken to Lab for analysis []  - 0 Patient Transfer (multiple staff / / Similar devices) []  - 0 Simple Staple / Suture removal (25 or less) []  - 0 Complex Staple / Suture removal (26 or more) []  - 0 Hypo / Hyperglycemic Management (close monitor of Blood Glucose) []  - 0 Ankle / Brachial Index (ABI) - do not check if billed separately []  - 0 Vital Signs Has the patient been seen at the hospital within the last three years: Yes Total Score: 0 Level Of Care: ____ Electronic Signature(s) Unsigned Entered By on 06/22/2021 10:47:28 Signature(s): Date(s): ( ) -------------------------------------------------------------------------------- Compression Therapy Details Patient Name: Julia Ashley, Julia Ashley Date of Service: 06/22/2021 10:15 AM Medical Record Number: Patient Account Number: Date of Birth/Sex: [birth year] (55 y.o. F) Treating RN: Primary  Care Yulia Ulrich: Other Clinician: Referring Travious Vanover: Nurse, adult Treating Jaiceon Collister/Extender: in Treatment: 1 Compression Therapy Performed for Wound Assessment: Wound #1 Right,Medial Lower Leg Performed By: Clinician , RN Compression Type: Four Layer Electronic Signature(s) Signed: 06/22/2021 10:46:35 AM By: RN Entered By: on 06/22/2021 10:46:35 06/24/2021 (Julia Ashley) -------------------------------------------------------------------------------- Encounter Discharge Information Details Patient Name: [id number] Date of Service: 06/22/2021 10:15 AM Medical Record Number: 06/24/2021 Patient Account Number: [id number] Date of Birth/Sex: 04/23/1966 (55 y.o. F) Treating RN: 53 Primary Care Cherine Drumgoole: Yevonne Pax Other Clinician: Referring Katria Botts: Aurora Mask Treating Aldine Chakraborty/Extender: Aurora Mask in Treatment: 1 Encounter Discharge Information Items Discharge Condition: Stable Ambulatory Status: Ambulatory Discharge Destination: Home Transportation: Private Auto Accompanied By: self Schedule Follow-up Appointment: Yes Clinical Summary of Care: Patient Declined Electronic Signature(s) Signed: 06/22/2021 10:47:05 AM By: Yevonne Pax RN Entered By: 06/24/2021 on 06/22/2021 10:47:05 Yevonne Pax (06/24/2021) -------------------------------------------------------------------------------- Wound Assessment Details Patient Name: Julia Ashley Date of Service: 06/22/2021 10:15 AM Medical Record Number: Julia Ashley Patient Account Number: 06/24/2021 Date of Birth/Sex: [birth year] (55 y.o. F) Treating RN: 10/04/1965 Primary Care Dejai Schubach: 53 Other Clinician: Referring Maelynn Moroney: Yevonne Pax Treating Domanik [place]/Extender: Aurora Mask in Treatment: 1 Wound Status Wound Number: 1 Primary Etiology: Trauma, Other Wound  Location: Right, Medial Lower Leg Wound Status: Open Wounding Event: Trauma Comorbid History: Hypertension Date Acquired: 05/30/2021 Weeks Of Treatment: 1 Clustered Wound: No Wound Measurements Length: (cm) 4.4 Width: (cm) 7.4 Depth: (cm) 1 Area: (cm) 25.573 Volume: (cm) 25.573 % Reduction in Area: 44.3% % Reduction in Volume: -85.5% Epithelialization: None Tunneling: No Undermining: No Wound Description Classification: Full Thickness Without Exposed Support Structu Exudate Amount: Medium Exudate Type: Serosanguineous Exudate Color: red, brown res Foul Odor After Cleansing: No Slough/Fibrino Yes Wound Bed Granulation Amount: Large (67-100%) Exposed Structure Granulation Quality: Red, Pink Fascia Exposed: No Necrotic Amount: Small (1-33%) Fat Layer (Subcutaneous Tissue) Exposed: Yes Necrotic Quality:  Adherent Slough Tendon Exposed: No Muscle Exposed: No Joint Exposed: No Bone Exposed: No Treatment Notes Wound #1 (Lower Leg) Wound Laterality: Right, Medial Cleanser Normal Saline Discharge Instruction: Wash your hands with soap and water. Remove old dressing, discard into plastic bag and place into trash. Cleanse the wound with Normal Saline prior to applying a clean dressing using gauze sponges, not tissues or cotton balls. Do not scrub or use excessive force. Pat dry using gauze sponges, not tissue or cotton balls. Soap and Water Discharge Instruction: Gently cleanse wound with antibacterial soap, rinse and pat dry prior to dressing wounds Peri-Wound Care Topical Primary Dressing Hydrofera Blue Ready Transfer Foam, 4x5 (in/in) Discharge Instruction: Cut thinner and insert into tunnels-Apply Hydrofera Blue Ready to wound bed as directed Secondary Dressing Zetuvit Plus Silicone Non-bordered 5x5 (in/in) Secured With Compression Wrap [place], [place] ([id number]) Medichoice 4 layer Compression System, 35-40 mmHG Discharge Instruction: Apply multi-layer wrap as  directed. Compression Stockings Add-Ons Electronic Signature(s) Signed: 06/22/2021 10:46:11 AM By: Yevonne Pax RN Entered By: Yevonne Pax on 06/22/2021 10:46:11

## 2021-06-26 ENCOUNTER — Encounter: Payer: BC Managed Care – PPO | Admitting: Physician Assistant

## 2021-06-26 ENCOUNTER — Other Ambulatory Visit: Payer: Self-pay

## 2021-06-26 DIAGNOSIS — S81811A Laceration without foreign body, right lower leg, initial encounter: Secondary | ICD-10-CM | POA: Diagnosis not present

## 2021-06-26 DIAGNOSIS — I10 Essential (primary) hypertension: Secondary | ICD-10-CM | POA: Diagnosis not present

## 2021-06-26 DIAGNOSIS — X58XXXA Exposure to other specified factors, initial encounter: Secondary | ICD-10-CM | POA: Diagnosis not present

## 2021-06-26 DIAGNOSIS — L97812 Non-pressure chronic ulcer of other part of right lower leg with fat layer exposed: Secondary | ICD-10-CM | POA: Diagnosis not present
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
## 2021-07-03 ENCOUNTER — Encounter: Payer: BC Managed Care – PPO | Admitting: Physician Assistant

## 2021-07-03 ENCOUNTER — Other Ambulatory Visit: Payer: Self-pay

## 2021-07-03 DIAGNOSIS — X58XXXA Exposure to other specified factors, initial encounter: Secondary | ICD-10-CM | POA: Diagnosis not present

## 2021-07-03 DIAGNOSIS — L97812 Non-pressure chronic ulcer of other part of right lower leg with fat layer exposed: Secondary | ICD-10-CM | POA: Diagnosis not present

## 2021-07-03 DIAGNOSIS — S81811A Laceration without foreign body, right lower leg, initial encounter: Secondary | ICD-10-CM | POA: Diagnosis not present

## 2021-07-03 DIAGNOSIS — I10 Essential (primary) hypertension: Secondary | ICD-10-CM | POA: Diagnosis not present

## 2021-07-03 NOTE — Progress Notes (Signed)
Julia, Ashley ([id number]) Visit Report for 07/03/2021 Arrival Information Details Patient Name: Julia Ashley, Julia Ashley Date of Service: 07/03/2021 10:30 AM Medical Record Number: [id number] Patient Account Number: [id number] Date of Birth/Sex: March 29, 1966 (55 y.o. F) Treating RN: Cornell Barman Primary Care Manju Kulkarni: Gemma Payor Other Clinician: Referring Kiah Vanalstine: Gemma Payor Treating Tequilla Cousineau/Extender: Skipper Cliche in Treatment: 3 Visit Information History Since Last Visit Added or deleted any medications: No Patient Arrived: Ambulatory Has Dressing in Place as Prescribed: Yes Arrival Time: 10:40 Has Compression in Place as Prescribed: Yes Accompanied By: self Pain Present Now: No Transfer Assistance: None Patient Identification Verified: Yes Secondary Verification Process Completed: Yes Patient Requires Transmission-Based Precautions: No Patient Has Alerts: No Electronic Signature(s) Signed: 07/03/2021 1:32:57 PM By: Gretta Cool, BSN, RN, CWS, Kim RN, BSN Entered By: Gretta Cool, BSN, RN, CWS, Kim on 07/03/2021 10:43:08 Julia Ashley ([id number]) -------------------------------------------------------------------------------- Encounter Discharge Information Details Patient Name: Julia Ashley Date of Service: 07/03/2021 10:30 AM Medical Record Number: [id number] Patient Account Number: [id number] Date of Birth/Sex: 12-25-1965 (55 y.o. F) Treating RN: Cornell Barman Primary Care Eugena Rhue: Gemma Payor Other Clinician: Referring Sharmel Ballantine: Gemma Payor Treating Mervyn Pflaum/Extender: Skipper Cliche in Treatment: 3 Encounter Discharge Information Items Post Procedure Vitals Discharge Condition: Stable Temperature (F): 98.2 Ambulatory Status: Ambulatory Pulse (bpm): 105 Discharge Destination: Home Respiratory Rate (breaths/min): 16 Transportation: Private Auto Blood Pressure (mmHg): 155/89 Accompanied By: self Schedule Follow-up Appointment: Yes Clinical  Summary of Care: Electronic Signature(s) Signed: 07/03/2021 1:32:44 PM By: Gretta Cool, BSN, RN, CWS, Kim RN, BSN Previous Signature: 07/03/2021 1:32:00 PM Version By: Gretta Cool, BSN, RN, CWS, Kim RN, BSN Entered By: Gretta Cool, BSN, RN, CWS, Kim on 07/03/2021 13:32:43 Julia Ashley ([id number]) -------------------------------------------------------------------------------- Lower Extremity Assessment Details Patient Name: Julia Ashley Date of Service: 07/03/2021 10:30 AM Medical Record Number: [id number] Patient Account Number: [id number] Date of Birth/Sex: 1966/04/14 (55 y.o. F) Treating RN: Cornell Barman Primary Care Glen Kesinger: Gemma Payor Other Clinician: Referring Tresea Heine: Gemma Payor Treating Cleophus Mendonsa/Extender: Skipper Cliche in Treatment: 3 Edema Assessment Assessed: [Left: No] [Right: No] [Left: Edema] [Right: :] Calf Left: Right: Point of Measurement: 34 cm From Medial Instep 47 cm Ankle Left: Right: Point of Measurement: 11 cm From Medial Instep 28.5 cm Vascular Assessment Pulses: Dorsalis Pedis Palpable: [Right:Yes] Electronic Signature(s) Signed: 07/03/2021 1:32:57 PM By: Gretta Cool, BSN, RN, CWS, Kim RN, BSN Entered By: Gretta Cool, BSN, RN, CWS, Kim on 07/03/2021 10:55:31 Julia Ashley ([id number]) -------------------------------------------------------------------------------- Multi Wound Chart Details Patient Name: Julia Ashley Date of Service: 07/03/2021 10:30 AM Medical Record Number: [id number] Patient Account Number: [id number] Date of Birth/Sex: 1965-10-21 (55 y.o. F) Treating RN: Cornell Barman Primary Care Izella Ybanez: Gemma Payor Other Clinician: Referring Ezriel Boffa: Gemma Payor Treating Bernerd Terhune/Extender: Skipper Cliche in Treatment: 3 Vital Signs Height(in): 72 Pulse(bpm): 85 Weight(lbs): 277 Blood Pressure(mmHg): 155/89 Body Mass Index(BMI): 38 Temperature(F): 98.2 Respiratory Rate(breaths/min): 18 Photos: [N/A:N/A] Wound  Location: Right, Medial Lower Leg N/A N/A Wounding Event: Trauma N/A N/A Primary Etiology: Trauma, Other N/A N/A Comorbid History: Hypertension N/A N/A Date Acquired: 05/30/2021 N/A N/A Weeks of Treatment: 3 N/A N/A Wound Status: Open N/A N/A Measurements L x W x D (cm) 3.8x3.5x1.5 N/A N/A Area (cm) : 10.446 N/A N/A Volume (cm) : 15.669 N/A N/A % Reduction in Area: 77.30% N/A N/A % Reduction in Volume: -13.70% N/A N/A Classification: Full Thickness Without Exposed N/A N/A Support Structures Exudate Amount: Medium N/A N/A Exudate Type: Serosanguineous N/A N/A Exudate Color: red, brown N/A N/A Granulation Amount: Large (67-100%) N/A N/A Granulation Quality: Red, Pink N/A N/A Necrotic Amount: Small (  1-33%) N/A N/A Exposed Structures: Fat Layer (Subcutaneous Tissue): N/A N/A Yes Fascia: No Tendon: No Muscle: No Joint: No Bone: No Epithelialization: None N/A N/A Assessment Notes: 1.5cm hole in the lower half of the N/A N/A wound. Treatment Notes Electronic Signature(s) Signed: 07/03/2021 1:32:57 PM By: Gretta Cool, BSN, RN, CWS, Kim RN, BSN Entered By: Gretta Cool, BSN, RN, CWS, Kim on 07/03/2021 11:03:23 Julia Ashley ([id number]) -------------------------------------------------------------------------------- Multi-Disciplinary Care Plan Details Patient Name: Julia Ashley Date of Service: 07/03/2021 10:30 AM Medical Record Number: [id number] Patient Account Number: [id number] Date of Birth/Sex: April 27, 1966 (55 y.o. F) Treating RN: Cornell Barman Primary Care Karlin Heilman: Gemma Payor Other Clinician: Referring Karsen Fellows: Gemma Payor Treating Lourdes Kucharski/Extender: Skipper Cliche in Treatment: 3 Active Inactive Wound/Skin Impairment Nursing Diagnoses: Knowledge deficit related to ulceration/compromised skin integrity Goals: Patient/caregiver will verbalize understanding of skin care regimen Date Initiated: 06/11/2021 Date Inactivated: 06/19/2021 Target Resolution  Date: 07/11/2021 Goal Status: Met Ulcer/skin breakdown will have a volume reduction of 30% by week 4 Date Initiated: 06/11/2021 Target Resolution Date: 08/11/2021 Goal Status: Active Ulcer/skin breakdown will have a volume reduction of 50% by week 8 Date Initiated: 06/11/2021 Target Resolution Date: 09/11/2021 Goal Status: Active Ulcer/skin breakdown will have a volume reduction of 80% by week 12 Date Initiated: 06/11/2021 Target Resolution Date: 10/09/2021 Goal Status: Active Ulcer/skin breakdown will heal within 14 weeks Date Initiated: 06/11/2021 Target Resolution Date: 11/09/2021 Goal Status: Active Interventions: Assess patient/caregiver ability to obtain necessary supplies Assess patient/caregiver ability to perform ulcer/skin care regimen upon admission and as needed Assess ulceration(s) every visit Notes: Electronic Signature(s) Signed: 07/03/2021 1:32:57 PM By: Gretta Cool, BSN, RN, CWS, Kim RN, BSN Entered By: Gretta Cool, BSN, RN, CWS, Kim on 07/03/2021 11:03:15 Julia Ashley ([id number]) -------------------------------------------------------------------------------- Pain Assessment Details Patient Name: Julia Ashley Date of Service: 07/03/2021 10:30 AM Medical Record Number: [id number] Patient Account Number: [id number] Date of Birth/Sex: 1965-10-08 (55 y.o. F) Treating RN: Cornell Barman Primary Care Geraldine Tesar: Gemma Payor Other Clinician: Referring Dilan Fullenwider: Gemma Payor Treating Yarielys Beed/Extender: Skipper Cliche in Treatment: 3 Active Problems Location of Pain Severity and Description of Pain Patient Has Paino No Site Locations Pain Management and Medication Current Pain Management: Notes Patient denies pain at this time. Electronic Signature(s) Signed: 07/03/2021 1:32:57 PM By: Gretta Cool, BSN, RN, CWS, Kim RN, BSN Entered By: Gretta Cool, BSN, RN, CWS, Kim on 07/03/2021 10:45:08 Julia Ashley  ([id number]) -------------------------------------------------------------------------------- Patient/Caregiver Education Details Patient Name: Julia Ashley Date of Service: 07/03/2021 10:30 AM Medical Record Number: [id number] Patient Account Number: [id number] Date of Birth/Gender: Apr 27, 1966 (55 y.o. F) Treating RN: Cornell Barman Primary Care Physician: Gemma Payor Other Clinician: Referring Physician: Gemma Payor Treating Physician/Extender: Skipper Cliche in Treatment: 3 Education Assessment Education Provided To: Patient Education Topics Provided Venous: Handouts: Controlling Swelling with Multilayered Compression Wraps Methods: Demonstration, Explain/Verbal Responses: State content correctly Wound/Skin Impairment: Handouts: Caring for Your Ulcer Methods: Demonstration, Explain/Verbal Responses: State content correctly Electronic Signature(s) Signed: 07/03/2021 1:32:57 PM By: Gretta Cool, BSN, RN, CWS, Kim RN, BSN Entered By: Gretta Cool, BSN, RN, CWS, Kim on 07/03/2021 13:31:14 Julia Ashley ([id number]) -------------------------------------------------------------------------------- Wound Assessment Details Patient Name: Julia Ashley Date of Service: 07/03/2021 10:30 AM Medical Record Number: [id number] Patient Account Number: [id number] Date of Birth/Sex: 05/23/1966 (55 y.o. F) Treating RN: Cornell Barman Primary Care Tyronica Truxillo: Gemma Payor Other Clinician: Referring Amram Maya: Gemma Payor Treating Denarius Sesler/Extender: Skipper Cliche in Treatment: 3 Wound Status Wound Number: 1 Primary Etiology: Trauma, Other Wound Location: Right, Medial Lower Leg Wound Status: Open Wounding Event: Trauma Comorbid History: Hypertension Date Acquired: 05/30/2021 [place]  Of Treatment: 3 Clustered Wound: No Photos Wound Measurements Length: (cm) 3.8 Width: (cm) 3.5 Depth: (cm) 1.5 Area: (cm) 10.446 Volume: (cm) 15.669 % Reduction in Area: 77.3% %  Reduction in Volume: -13.7% Epithelialization: None Wound Description Classification: Full Thickness Without Exposed Support Structu Exudate Amount: Medium Exudate Type: Serosanguineous Exudate Color: red, brown res Foul Odor After Cleansing: No Slough/Fibrino Yes Wound Bed Granulation Amount: Large (67-100%) Exposed Structure Granulation Quality: Red, Pink Fascia Exposed: No Necrotic Amount: Small (1-33%) Fat Layer (Subcutaneous Tissue) Exposed: Yes Necrotic Quality: Adherent Slough Tendon Exposed: No Muscle Exposed: No Joint Exposed: No Bone Exposed: No Assessment Notes 1.5cm hole in the lower half of the wound. Treatment Notes Wound #1 (Lower Leg) Wound Laterality: Right, Medial Cleanser Normal Saline [place], Lattie Haw ([id number]) Discharge Instruction: Wash your hands with soap and water. Remove old dressing, discard into plastic bag and place into trash. Cleanse the wound with Normal Saline prior to applying a clean dressing using gauze sponges, not tissues or cotton balls. Do not scrub or use excessive force. Pat dry using gauze sponges, not tissue or cotton balls. Soap and Water Discharge Instruction: Gently cleanse wound with antibacterial soap, rinse and pat dry prior to dressing wounds Peri-Wound Care Topical Primary Dressing Hydrofera Blue Ready Transfer Foam, 4x5 (in/in) Discharge Instruction: Cut thinner and insert into tunnels-Apply Hydrofera Blue Ready to wound bed as directed Mepitel One Silicone Wound Contact Layer, 2x3 (in/in) Discharge Instruction: Add to wound bed to prevent sticking. Secondary Dressing Zetuvit Plus Silicone Non-bordered 5x5 (in/in) Secured With Compression Wrap Medichoice 4 layer Compression System, 35-40 mmHG Discharge Instruction: Apply multi-layer wrap as directed. Compression Stockings Environmental education officer) Signed: 07/03/2021 1:32:57 PM By: Gretta Cool, BSN, RN, CWS, Kim RN, BSN Entered By: Gretta Cool, BSN, RN, CWS, Kim on  07/03/2021 10:57:57 Julia Ashley ([id number]) -------------------------------------------------------------------------------- [place] Details Patient Name: Julia Ashley Date of Service: 07/03/2021 10:30 AM Medical Record Number: [id number] Patient Account Number: [id number] Date of Birth/Sex: [birth year] (55 y.o. F) Treating RN: Cornell Barman Primary Care Gorje Iyer: Gemma Payor Other Clinician: Referring Tanga Gloor: Gemma Payor Treating Takenya Travaglini/Extender: Skipper Cliche in Treatment: 3 Vital Signs Time Taken: 10:43 Temperature (F): 98.2 Height (in): 72 Pulse (bpm): 85 Weight (lbs): 277 Respiratory Rate (breaths/min): 18 Body Mass Index (BMI): 37.6 Blood Pressure (mmHg): 155/89 Reference Range: 80 - 120 mg / dl Notes Patient is working with PCP who is following BP at this time. Electronic Signature(s) Signed: 07/03/2021 1:32:57 PM By: Gretta Cool, BSN, RN, CWS, Kim RN, BSN Entered By: Gretta Cool, BSN, RN, CWS, Kim on 07/03/2021 10:44:53

## 2021-07-03 NOTE — Progress Notes (Addendum)
SHAWNEEQUA, BALDRIDGE ([id number]) Visit Report for 07/03/2021 Chief Complaint Document Details Patient Name: Julia Ashley, Julia Ashley Date of Service: 07/03/2021 10:30 AM Medical Record Number: [id number] Patient Account Number: [id number] Date of Birth/Sex: 03-08-1966 (55 y.o. F) Treating RN: Huel Coventry Primary Care Provider: Aurora Mask Other Clinician: Referring Provider: Aurora Mask Treating Provider/Extender: Rowan Blase in Treatment: 3 Information Obtained from: Patient Chief Complaint Laceration right leg Electronic Signature(s) Signed: 07/03/2021 10:56:57 AM By: Lenda Kelp PA-C Entered By: Lenda Kelp on 07/03/2021 10:56:57 Julia Ashley ([id number]) -------------------------------------------------------------------------------- Debridement Details Patient Name: Julia Ashley Date of Service: 07/03/2021 10:30 AM Medical Record Number: [id number] Patient Account Number: [id number] Date of Birth/Sex: 05/13/1966 (55 y.o. F) Treating RN: Huel Coventry Primary Care Provider: Aurora Mask Other Clinician: Referring Provider: Aurora Mask Treating Provider/Extender: Rowan Blase in Treatment: 3 Debridement Performed for Wound #1 Right,Medial Lower Leg Assessment: Performed By: Physician Nelida Meuse., PA-C Debridement Type: Debridement Level of Consciousness (Pre- Awake and Alert procedure): Pre-procedure Verification/Time Out Yes - 11:03 Taken: Total Area Debrided (L x W): 3.8 (cm) x 1 (cm) = 3.8 (cm) Tissue and other material Non-Viable, Skin: Dermis , Skin: Epidermis debrided: Level: Skin/Epidermis Debridement Description: Selective/Open Wound Instrument: Curette Bleeding: Minimum Hemostasis Achieved: Pressure Response to Treatment: Procedure was tolerated well Level of Consciousness (Post- Awake and Alert procedure): Post Debridement Measurements of Total Wound Length: (cm) 3.8 Width: (cm) 3.5 Depth: (cm)  1.5 Volume: (cm) 15.669 Character of Wound/Ulcer Post Debridement: Stable Post Procedure Diagnosis Same as Pre-procedure Electronic Signature(s) Signed: 07/03/2021 1:32:57 PM By: Elliot Gurney, BSN, RN, CWS, Kim RN, BSN Signed: 07/03/2021 4:57:43 PM By: Lenda Kelp PA-C Entered By: Elliot Gurney, BSN, RN, CWS, Kim on 07/03/2021 11:04:22 Julia Ashley ([id number]) -------------------------------------------------------------------------------- HPI Details Patient Name: Julia Ashley Date of Service: 07/03/2021 10:30 AM Medical Record Number: [id number] Patient Account Number: [id number] Date of Birth/Sex: [birth year] (55 y.o. F) Treating RN: Huel Coventry Primary Care Provider: Aurora Mask Other Clinician: Referring Provider: Aurora Mask Treating Provider/Extender: Rowan Blase in Treatment: 3 History of Present Illness HPI Description: 06/11/2021 upon evaluation today patient presents for initial inspection here in the clinic concerning an issue that she had with a laceration on her right leg that occurred on May 30, 2021. This was when she was on a cruise they were actually in the [place] and they were on land off ship when this occurred she is not even sure what she hit on the scooter but nonetheless had a significant laceration. Subsequently her husband used a bungee cord to tourniquet the leg she tells me that it hurts severely. Nonetheless when she gets to the ship they actually did attempt to suture this back to the area that was sutured back appears to have survived the other half did not. Rating to remove the sutures today and then subsequently cleaned away any of the dead necrotic tissue. Fortunately I do not see any signs of active infection systemically at this point. That is great news overall locally I think that infection is definitely something that we will get a need to consider here. Especially with the significant debridement like we are doing I will  probably put her on a prophylactic antibiotic just to be on the safe side. I discussed that with the patient today as well. She has a history of hypertension but no other major medical problems. She does seem to bruise and bleed easily however. She has no history of any hematologic conditions. 06/19/2021 upon evaluation today patient's wound is actually showing  signs of improvement. I am actually very pleased with where we stand and I think she is making progress. This again is a significant wound that occurred and I saw her for the first time last week overall it is good to take a bit of healing to fill in the center part of the wound where this is much deeper but nonetheless I believe that this is achievable. 06/26/2021 upon evaluation today patient appears to be making good progress. This is still a significant wound and is going to take a bit of time to get it where we want to have it. Nonetheless I think she is definitely making excellent improvements as we stand here currently. Fortunately I do not see any evidence of active infection systemically at this point which is great news. Locally is also doing great. She does have a little blister on the inside of her foot that seems to be rubbing I think this is due to the wrap and the fact that it is really tight getting into the crocs. 07/03/2021 upon evaluation today patient appears to be doing well with regard to her wound. This is measuring smaller which is good with that being said there is some dry skin around the edge of [place] work on just a little bit. Also think that she seems to be doing well with regard to the depth of the center part of the wound where she has a much deeper area here occurring. Nonetheless I think that we probably need to go ahead and see about packing this slightly less with regard to the [facility] so that it has room to grow again. Otherwise I think things are doing decently well. Electronic Signature(s) Signed:  07/03/2021 1:55:59 PM By: Lenda Kelp PA-C Entered By: Lenda Kelp on 07/03/2021 13:55:58 [place], Misty Stanley ([id number]) -------------------------------------------------------------------------------- Physical Exam Details Patient Name: Julia Ashley Date of Service: 07/03/2021 10:30 AM Medical Record Number: [id number] Patient Account Number: [id number] Date of Birth/Sex: [birth year] (55 y.o. F) Treating RN: Huel Coventry Primary Care Provider: Aurora Mask Other Clinician: Referring Provider: Aurora Mask Treating Provider/Extender: Rowan Blase in Treatment: 3 Constitutional Well-nourished and well-hydrated in no acute distress. Respiratory normal breathing without difficulty. Psychiatric this patient is able to make decisions and demonstrates good insight into disease process. Alert and Oriented x 3. pleasant and cooperative. Notes Upon inspection patient's wound bed actually showed signs of good granulation and epithelization at this point. Fortunately there does not appear to be any evidence of active infection locally nor systemically at this point. Electronic Signature(s) Signed: 07/03/2021 1:56:13 PM By: Lenda Kelp PA-C Entered By: Lenda Kelp on 07/03/2021 13:56:13 [place], Misty Stanley ([id number]) -------------------------------------------------------------------------------- Physician Orders Details Patient Name: Julia Ashley Date of Service: 07/03/2021 10:30 AM Medical Record Number: [id number] Patient Account Number: [id number] Date of Birth/Sex: 1966/01/16 (55 y.o. F) Treating RN: Huel Coventry Primary Care Provider: Aurora Mask Other Clinician: Referring Provider: Aurora Mask Treating Provider/Extender: Rowan Blase in Treatment: 3 Verbal / Phone Orders: No Diagnosis Coding ICD-10 Coding Code Description [phone] Laceration without foreign body, right lower leg, initial encounter L97.812 Non-pressure chronic  ulcer of other part of right lower leg with fat layer exposed I10 Essential (primary) hypertension Follow-up Appointments o Return Appointment in 1 week. - provider once a week (change wrap twice weekly) o Nurse Visit as needed - once a week (change wrap twice weekly) Bathing/ Shower/ Hygiene o May shower with wound dressing protected with water repellent cover or cast protector. o No tub  bath. Edema Control - Lymphedema / Segmental Compressive Device / Other o Optional: One layer of unna paste to top of compression wrap (to act as an anchor). o Elevate, Exercise Daily and Avoid Standing for Long Periods of Time. o Elevate legs to the level of the heart and pump ankles as often as possible o Elevate leg(s) parallel to the floor when sitting. Additional Orders / Instructions o Follow Nutritious Diet and Increase Protein Intake Wound Treatment Wound #1 - Lower Leg Wound Laterality: Right, Medial Cleanser: Normal Saline 2 x Per Week/30 Days Discharge Instructions: Wash your hands with soap and water. Remove old dressing, discard into plastic bag and place into trash. Cleanse the wound with Normal Saline prior to applying a clean dressing using gauze sponges, not tissues or cotton balls. Do not scrub or use excessive force. Pat dry using gauze sponges, not tissue or cotton balls. Cleanser: Soap and Water 2 x Per Week/30 Days Discharge Instructions: Gently cleanse wound with antibacterial soap, rinse and pat dry prior to dressing wounds Primary Dressing: Hydrofera Blue Ready Transfer Foam, 4x5 (in/in) 2 x Per Week/30 Days Discharge Instructions: Cut thinner and insert into tunnels-Apply Hydrofera Blue Ready to wound bed as directed Primary Dressing: Mepitel One Silicone Wound Contact Layer, 2x3 (in/in) 2 x Per Week/30 Days Discharge Instructions: Add to wound bed to prevent sticking. Secondary Dressing: Zetuvit Plus Silicone Non-bordered 5x5 (in/in) 2 x Per Week/30  Days Compression Wrap: Medichoice 4 layer Compression System, 35-40 mmHG 2 x Per Week/30 Days Discharge Instructions: Apply multi-layer wrap as directed. Electronic Signature(s) Signed: 07/03/2021 1:32:57 PM By: Elliot Gurney, BSN, RN, CWS, Kim RN, BSN Signed: 07/03/2021 4:57:43 PM By: Lenda Kelp PA-C Entered By: Elliot Gurney BSN, RN, CWS, Kim on 07/03/2021 11:05:27 Julia Ashley ([id number]) -------------------------------------------------------------------------------- Problem List Details Patient Name: Julia Ashley Date of Service: 07/03/2021 10:30 AM Medical Record Number: [id number] Patient Account Number: [id number] Date of Birth/Sex: 24-Dec-1965 (55 y.o. F) Treating RN: Huel Coventry Primary Care Provider: Aurora Mask Other Clinician: Referring Provider: Aurora Mask Treating Provider/Extender: Rowan Blase in Treatment: 3 Active Problems ICD-10 Encounter Code Description Active Date MDM Diagnosis S81.811A Laceration without foreign body, right lower leg, initial encounter 06/11/2021 No Yes L97.812 Non-pressure chronic ulcer of other part of right lower leg with fat layer 06/11/2021 No Yes exposed I10 Essential (primary) hypertension 06/11/2021 No Yes Inactive Problems Resolved Problems Electronic Signature(s) Signed: 07/03/2021 10:56:51 AM By: Lenda Kelp PA-C Entered By: Lenda Kelp on 07/03/2021 10:56:51 [place], Misty Stanley ([id number]) -------------------------------------------------------------------------------- Progress Note Details Patient Name: Julia Ashley Date of Service: 07/03/2021 10:30 AM Medical Record Number: [id number] Patient Account Number: [id number] Date of Birth/Sex: 12-10-1965 (55 y.o. F) Treating RN: Huel Coventry Primary Care Provider: Aurora Mask Other Clinician: Referring Provider: Aurora Mask Treating Provider/Extender: Rowan Blase in Treatment: 3 Subjective Chief Complaint Information obtained from  Patient Laceration right leg History of Present Illness (HPI) 06/11/2021 upon evaluation today patient presents for initial inspection here in the clinic concerning an issue that she had with a laceration on her right leg that occurred on May 30, 2021. This was when she was on a cruise they were actually in the [place] and they were on land off ship when this occurred she is not even sure what she hit on the scooter but nonetheless had a significant laceration. Subsequently her husband used a bungee cord to tourniquet the leg she tells me that it hurts severely. Nonetheless when she gets to the ship they actually did attempt to  suture this back to the area that was sutured back appears to have survived the other half did not. Rating to remove the sutures today and then subsequently cleaned away any of the dead necrotic tissue. Fortunately I do not see any signs of active infection systemically at this point. That is great news overall locally I think that infection is definitely something that we will get a need to consider here. Especially with the significant debridement like we are doing I will probably put her on a prophylactic antibiotic just to be on the safe side. I discussed that with the patient today as well. She has a history of hypertension but no other major medical problems. She does seem to bruise and bleed easily however. She has no history of any hematologic conditions. 06/19/2021 upon evaluation today patient's wound is actually showing signs of improvement. I am actually very pleased with where we stand and I think she is making progress. This again is a significant wound that occurred and I saw her for the first time last week overall it is good to take a bit of healing to fill in the center part of the wound where this is much deeper but nonetheless I believe that this is achievable. 06/26/2021 upon evaluation today patient appears to be making good progress. This is still a  significant wound and is going to take a bit of time to get it where we want to have it. Nonetheless I think she is definitely making excellent improvements as we stand here currently. Fortunately I do not see any evidence of active infection systemically at this point which is great news. Locally is also doing great. She does have a little blister on the inside of her foot that seems to be rubbing I think this is due to the wrap and the fact that it is really tight getting into the crocs. 07/03/2021 upon evaluation today patient appears to be doing well with regard to her wound. This is measuring smaller which is good with that being said there is some dry skin around the edge of [place] work on just a little bit. Also think that she seems to be doing well with regard to the depth of the center part of the wound where she has a much deeper area here occurring. Nonetheless I think that we probably need to go ahead and see about packing this slightly less with regard to the [facility] so that it has room to grow again. Otherwise I think things are doing decently well. Objective Constitutional Well-nourished and well-hydrated in no acute distress. Vitals Time Taken: 10:43 AM, Height: 72 in, Weight: 277 lbs, BMI: 37.6, Temperature: 98.2 F, Pulse: 85 bpm, Respiratory Rate: 18 breaths/min, Blood Pressure: 155/89 mmHg. General Notes: Patient is working with PCP who is following BP at this time. Respiratory normal breathing without difficulty. Psychiatric this patient is able to make decisions and demonstrates good insight into disease process. Alert and Oriented x 3. pleasant and cooperative. General Notes: Upon inspection patient's wound bed actually showed signs of good granulation and epithelization at this point. Fortunately there does not appear to be any evidence of active infection locally nor systemically at this point. Integumentary (Hair, Skin) Wound #1 status is Open. Original cause of  wound was Trauma. The date acquired was: 05/30/2021. The wound has been in treatment 3 weeks. The wound is located on the Right,Medial Lower Leg. The wound measures 3.8cm length x 3.5cm width x 1.5cm depth; 10.446cm^2 area and 15.669cm^3  volume. There is Fat Layer (Subcutaneous Tissue) exposed. There is a medium amount of serosanguineous drainage noted. There is [place], Misty Stanley ([id number]) large (67-100%) red, pink granulation within the wound bed. There is a small (1-33%) amount of necrotic tissue within the wound bed including [place]. General Notes: 1.5cm hole in the lower half of the wound. Assessment Active Problems ICD-10 Laceration without foreign body, right lower leg, initial encounter Non-pressure chronic ulcer of other part of right lower leg with fat layer exposed Essential (primary) hypertension Procedures Wound #1 Pre-procedure diagnosis of Wound #1 is a Trauma, Other located on the Right,Medial Lower Leg . There was a Selective/Open Wound Skin/Epidermis Debridement with a total area of 3.8 sq cm performed by Nelida Meuse., PA-C. With the following instrument(s): Curette to remove Non-Viable tissue/material. Material removed includes Skin: Dermis and Skin: Epidermis and. No specimens were taken. A time out was conducted at 11:03, prior to the start of the procedure. A Minimum amount of bleeding was controlled with Pressure. The procedure was tolerated well. Post Debridement Measurements: 3.8cm length x 3.5cm width x 1.5cm depth; 15.669cm^3 volume. Character of Wound/Ulcer Post Debridement is stable. Post procedure Diagnosis Wound #1: Same as Pre-Procedure Plan Follow-up Appointments: Return Appointment in 1 week. - provider once a week (change wrap twice weekly) Nurse Visit as needed - once a week (change wrap twice weekly) Bathing/ Shower/ Hygiene: May shower with wound dressing protected with water repellent cover or cast protector. No tub bath. Edema Control -  Lymphedema / Segmental Compressive Device / Other: Optional: One layer of unna paste to top of compression wrap (to act as an anchor). Elevate, Exercise Daily and Avoid Standing for Long Periods of Time. Elevate legs to the level of the heart and pump ankles as often as possible Elevate leg(s) parallel to the floor when sitting. Additional Orders / Instructions: Follow Nutritious Diet and Increase Protein Intake WOUND #1: - Lower Leg Wound Laterality: Right, Medial Cleanser: Normal Saline 2 x Per Week/30 Days Discharge Instructions: Wash your hands with soap and water. Remove old dressing, discard into plastic bag and place into trash. Cleanse the wound with Normal Saline prior to applying a clean dressing using gauze sponges, not tissues or cotton balls. Do not scrub or use excessive force. Pat dry using gauze sponges, not tissue or cotton balls. Cleanser: Soap and Water 2 x Per Week/30 Days Discharge Instructions: Gently cleanse wound with antibacterial soap, rinse and pat dry prior to dressing wounds Primary Dressing: Hydrofera Blue Ready Transfer Foam, 4x5 (in/in) 2 x Per Week/30 Days Discharge Instructions: Cut thinner and insert into tunnels-Apply Hydrofera Blue Ready to wound bed as directed Primary Dressing: Mepitel One Silicone Wound Contact Layer, 2x3 (in/in) 2 x Per Week/30 Days Discharge Instructions: Add to wound bed to prevent sticking. Secondary Dressing: Zetuvit Plus Silicone Non-bordered 5x5 (in/in) 2 x Per Week/30 Days Compression Wrap: Medichoice 4 layer Compression System, 35-40 mmHG 2 x Per Week/30 Days Discharge Instructions: Apply multi-layer wrap as directed. 1. I would recommend going to continue with the [facility] that we will use just a thin piece to put down into the depth of the wound so it has room to granulate in that is can be the key here. 2. I am also can recommend that we go ahead and use a contact layer and then apply the second piece of the Hydrofera  Blue over top. 3. I am also can recommend the patient continue to utilize the 4-layer compression wrap which I  think is doing a good job for her as well. [place], Misty Stanley ([id number]) We will see patient back for reevaluation in 1 week here in the clinic. If anything worsens or changes patient will contact our office for additional recommendations. Electronic Signature(s) Signed: 07/03/2021 1:56:43 PM By: Lenda Kelp PA-C Entered By: Lenda Kelp on 07/03/2021 13:56:43 [place], Misty Stanley ([id number]) -------------------------------------------------------------------------------- SuperBill Details Patient Name: Julia Ashley Date of Service: 07/03/2021 Medical Record Number: [id number] Patient Account Number: [id number] Date of Birth/Sex: Feb 12, 1966 (55 y.o. F) Treating RN: Huel Coventry Primary Care Provider: Aurora Mask Other Clinician: Referring Provider: Aurora Mask Treating Provider/Extender: Rowan Blase in Treatment: 3 Diagnosis Coding ICD-10 Codes Code Description [phone] Laceration without foreign body, right lower leg, initial encounter L97.812 Non-pressure chronic ulcer of other part of right lower leg with fat layer exposed I10 Essential (primary) hypertension Facility Procedures CPT4 Code: [id number] Description: [phone] - DEBRIDE WOUND 1ST 20 SQ CM OR < Modifier: Quantity: 1 CPT4 Code: Description: ICD-10 Diagnosis Description L97.812 Non-pressure chronic ulcer of other part of right lower leg with fat layer Modifier: exposed Quantity: Physician Procedures CPT4 Code: [id number] Description: 97597 - WC PHYS DEBR WO ANESTH 20 SQ CM Modifier: Quantity: 1 CPT4 Code: Description: ICD-10 Diagnosis Description L97.812 Non-pressure chronic ulcer of other part of right lower leg with fat layer Modifier: exposed Quantity: Electronic Signature(s) Signed: 07/03/2021 1:56:54 PM By: Lenda Kelp PA-C Entered By: Lenda Kelp on 07/03/2021  13:56:53

## 2021-07-06 DIAGNOSIS — M1611 Unilateral primary osteoarthritis, right hip: Secondary | ICD-10-CM | POA: Diagnosis not present

## 2021-07-06 DIAGNOSIS — M1711 Unilateral primary osteoarthritis, right knee: Secondary | ICD-10-CM | POA: Diagnosis not present

## 2021-07-06 DIAGNOSIS — M1712 Unilateral primary osteoarthritis, left knee: Secondary | ICD-10-CM | POA: Diagnosis not present

## 2021-07-06 DIAGNOSIS — M170 Bilateral primary osteoarthritis of knee: Secondary | ICD-10-CM | POA: Diagnosis not present

## 2021-07-10 ENCOUNTER — Other Ambulatory Visit: Payer: Self-pay

## 2021-07-10 ENCOUNTER — Encounter: Payer: BC Managed Care – PPO | Attending: Physician Assistant | Admitting: Physician Assistant

## 2021-07-10 DIAGNOSIS — L97812 Non-pressure chronic ulcer of other part of right lower leg with fat layer exposed: Secondary | ICD-10-CM | POA: Insufficient documentation

## 2021-07-10 DIAGNOSIS — W228XXA Striking against or struck by other objects, initial encounter: Secondary | ICD-10-CM | POA: Insufficient documentation

## 2021-07-10 DIAGNOSIS — I10 Essential (primary) hypertension: Secondary | ICD-10-CM | POA: Diagnosis not present

## 2021-07-10 DIAGNOSIS — S81811A Laceration without foreign body, right lower leg, initial encounter: Secondary | ICD-10-CM | POA: Diagnosis not present
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
## 2021-07-10 NOTE — Progress Notes (Signed)
Julia Ashley, Julia Ashley ([id number]) Visit Report for 07/10/2021 Arrival Information Details Patient Name: Julia Ashley, Julia Ashley Date of Service: 07/10/2021 8:00 AM Medical Record Number: [id number] Patient Account Number: [id number] Date of Birth/Sex: [birth year] (55 y.o. F) Treating RN: Carlene Coria Primary Care Sheppard Luckenbach: Gemma Payor Other Clinician: Referring Rie Mcneil: Gemma Payor Treating Nicloe Frontera/Extender: Skipper Cliche in Treatment: 4 Visit Information History Since Last Visit All ordered tests and consults were completed: No Patient Arrived: Ambulatory Added or deleted any medications: No Arrival Time: 08:09 Any new allergies or adverse reactions: No Accompanied By: self Had a fall or experienced change in No Transfer Assistance: None activities of daily living that may affect Patient Identification Verified: Yes risk of falls: Secondary Verification Process Completed: Yes Signs or symptoms of abuse/neglect since last visito No Patient Requires Transmission-Based Precautions: No Hospitalized since last visit: No Patient Has Alerts: No Implantable device outside of the clinic excluding No cellular tissue based products placed in the center since last visit: Has Dressing in Place as Prescribed: Yes Has Compression in Place as Prescribed: Yes Pain Present Now: No Electronic Signature(s) Signed: 07/10/2021 10:19:34 AM By: Carlene Coria RN Entered By: Carlene Coria on 07/10/2021 08:10:06 Julia Ashley ([id number]) -------------------------------------------------------------------------------- Clinic Level of Care Assessment Details Patient Name: Julia Ashley Date of Service: 07/10/2021 8:00 AM Medical Record Number: [id number] Patient Account Number: [id number] Date of Birth/Sex: 05-08-1966 (55 y.o. F) Treating RN: Carlene Coria Primary Care Nathin Saran: Gemma Payor Other Clinician: Referring Sunita Demond: Gemma Payor Treating Briella Hobday/Extender: Skipper Cliche in Treatment: 4 Clinic Level of Care Assessment Items TOOL 4 Quantity Score '[]'  - Use when only an EandM is performed on FOLLOW-UP visit 0 ASSESSMENTS - Nursing Assessment / Reassessment '[]'  - Reassessment of Co-morbidities (includes updates in patient status) 0 '[]'  - 0 Reassessment of Adherence to Treatment Plan ASSESSMENTS - Wound and Skin Assessment / Reassessment '[]'  - Simple Wound Assessment / Reassessment - one wound 0 '[]'  - 0 Complex Wound Assessment / Reassessment - multiple wounds '[]'  - 0 Dermatologic / Skin Assessment (not related to wound area) ASSESSMENTS - Focused Assessment '[]'  - Circumferential Edema Measurements - multi extremities 0 '[]'  - 0 Nutritional Assessment / Counseling / Intervention '[]'  - 0 Lower Extremity Assessment (monofilament, tuning fork, pulses) '[]'  - 0 Peripheral Arterial Disease Assessment (using hand held doppler) ASSESSMENTS - Ostomy and/or Continence Assessment and Care '[]'  - Incontinence Assessment and Management 0 '[]'  - 0 Ostomy Care Assessment and Management (repouching, etc.) PROCESS - Coordination of Care '[]'  - Simple Patient / Family Education for ongoing care 0 '[]'  - 0 Complex (extensive) Patient / Family Education for ongoing care '[]'  - 0 Staff obtains Programmer, systems, Records, Test Results / Process Orders '[]'  - 0 Staff telephones HHA, Nursing Homes / Clarify orders / etc '[]'  - 0 Routine Transfer to another Facility (non-emergent condition) '[]'  - 0 Routine Hospital Admission (non-emergent condition) '[]'  - 0 New Admissions / Biomedical engineer / Ordering NPWT, Apligraf, etc. '[]'  - 0 Emergency Hospital Admission (emergent condition) '[]'  - 0 Simple Discharge Coordination '[]'  - 0 Complex (extensive) Discharge Coordination PROCESS - Special Needs '[]'  - Pediatric / Minor Patient Management 0 '[]'  - 0 Isolation Patient Management '[]'  - 0 Hearing / Language / Visual special needs '[]'  - 0 Assessment of Community assistance (transportation, D/C  planning, etc.) '[]'  - 0 Additional assistance / Altered mentation '[]'  - 0 Support Surface(s) Assessment (bed, cushion, seat, etc.) INTERVENTIONS - Wound Cleansing / Measurement Julia Ashley, Julia Ashley ([id number]) '[]'  - 0 Simple Wound Cleansing - one wound '[]'  -  0 Complex Wound Cleansing - multiple wounds '[]'  - 0 Wound Imaging (photographs - any number of wounds) '[]'  - 0 Wound Tracing (instead of photographs) '[]'  - 0 Simple Wound Measurement - one wound '[]'  - 0 Complex Wound Measurement - multiple wounds INTERVENTIONS - Wound Dressings '[]'  - Small Wound Dressing one or multiple wounds 0 '[]'  - 0 Medium Wound Dressing one or multiple wounds '[]'  - 0 Large Wound Dressing one or multiple wounds '[]'  - 0 Application of Medications - topical '[]'  - 0 Application of Medications - injection INTERVENTIONS - Miscellaneous '[]'  - External ear exam 0 '[]'  - 0 Specimen Collection (cultures, biopsies, blood, body fluids, etc.) '[]'  - 0 Specimen(s) / Culture(s) sent or taken to Lab for analysis '[]'  - 0 Patient Transfer (multiple staff / Civil Service fast streamer / Similar devices) '[]'  - 0 Simple Staple / Suture removal (25 or less) '[]'  - 0 Complex Staple / Suture removal (26 or more) '[]'  - 0 Hypo / Hyperglycemic Management (close monitor of Blood Glucose) '[]'  - 0 Ankle / Brachial Index (ABI) - do not check if billed separately '[]'  - 0 Vital Signs Has the patient been seen at the hospital within the last three years: Yes Total Score: 0 Level Of Care: ____ Electronic Signature(s) Signed: 07/10/2021 10:19:34 AM By: Carlene Coria RN Entered By: Carlene Coria on 07/10/2021 08:29:35 Julia Ashley ([id number]) -------------------------------------------------------------------------------- Compression Therapy Details Patient Name: Julia Ashley Date of Service: 07/10/2021 8:00 AM Medical Record Number: [id number] Patient Account Number: [id number] Date of Birth/Sex: Sep 13, 1965 (55 y.o. F) Treating RN: Carlene Coria Primary Care  Kyshawn Teal: Gemma Payor Other Clinician: Referring Fareed Fung: Gemma Payor Treating Jeanetta Alonzo/Extender: Skipper Cliche in Treatment: 4 Compression Therapy Performed for Wound Assessment: Wound #1 Right,Medial Lower Leg Performed By: Clinician Carlene Coria, RN Compression Type: Three Layer Post Procedure Diagnosis Same as Pre-procedure Electronic Signature(s) Signed: 07/10/2021 10:19:34 AM By: Carlene Coria RN Entered By: Carlene Coria on 07/10/2021 08:29:16 Julia Ashley ([id number]) -------------------------------------------------------------------------------- Encounter Discharge Information Details Patient Name: Julia Ashley Date of Service: 07/10/2021 8:00 AM Medical Record Number: [id number] Patient Account Number: [id number] Date of Birth/Sex: [birth year] (55 y.o. F) Treating RN: Carlene Coria Primary Care Kathleena Freeman: Gemma Payor Other Clinician: Referring Quinnie Barcelo: Gemma Payor Treating Bryony Kaman/Extender: Skipper Cliche in Treatment: 4 Encounter Discharge Information Items Discharge Condition: Stable Ambulatory Status: Ambulatory Discharge Destination: Home Transportation: Private Auto Accompanied By: self Schedule Follow-up Appointment: Yes Clinical Summary of Care: Patient Declined Electronic Signature(s) Signed: 07/10/2021 10:19:34 AM By: Carlene Coria RN Entered By: Carlene Coria on 07/10/2021 08:44:18 Julia Ashley ([id number]) -------------------------------------------------------------------------------- Lower Extremity Assessment Details Patient Name: Julia Ashley Date of Service: 07/10/2021 8:00 AM Medical Record Number: [id number] Patient Account Number: [id number] Date of Birth/Sex: 1966-07-31 (55 y.o. F) Treating RN: Carlene Coria Primary Care Shahil Speegle: Gemma Payor Other Clinician: Referring Abran Gavigan: Gemma Payor Treating Sylvia Kondracki/Extender: Skipper Cliche in Treatment: 4 Edema Assessment Assessed:  [Left: No] [Right: No] Edema: [Left: Ye] [Right: s] Calf Left: Right: Point of Measurement: 34 cm From Medial Instep 44 cm Ankle Left: Right: Point of Measurement: 11 cm From Medial Instep 27 cm Vascular Assessment Pulses: Dorsalis Pedis Palpable: [Right:Yes] Electronic Signature(s) Signed: 07/10/2021 10:19:34 AM By: Carlene Coria RN Entered By: Carlene Coria on 07/10/2021 08:24:02 Julia Ashley ([id number]) -------------------------------------------------------------------------------- Multi Wound Chart Details Patient Name: Julia Ashley Date of Service: 07/10/2021 8:00 AM Medical Record Number: [id number] Patient Account Number: [id number] Date of Birth/Sex: [birth year] (55 y.o. F) Treating RN: Carlene Coria Primary Care Nykole Matos: Gemma Payor Other Clinician: Referring Eyvonne Burchfield: Achille Rich  Roger Treating Amarrion Pastorino/Extender: Jeri Cos Weeks in Treatment: 4 Vital Signs Height(in): 72 Pulse(bpm): 85 Weight(lbs): 277 Blood Pressure(mmHg): 164/99 Body Mass Index(BMI): 38 Temperature(F): 98.2 Respiratory Rate(breaths/min): 18 Photos: [N/A:N/A] Wound Location: Right, Medial Lower Leg N/A N/A Wounding Event: Trauma N/A N/A Primary Etiology: Trauma, Other N/A N/A Comorbid History: Hypertension N/A N/A Date Acquired: 05/30/2021 N/A N/A Weeks of Treatment: 4 N/A N/A Wound Status: Open N/A N/A Measurements L x W x D (cm) 3.5x3x0.7 N/A N/A Area (cm) : 8.247 N/A N/A Volume (cm) : 5.773 N/A N/A % Reduction in Area: 82.10% N/A N/A % Reduction in Volume: 58.10% N/A N/A Classification: Full Thickness Without Exposed N/A N/A Support Structures Exudate Amount: Medium N/A N/A Exudate Type: Serosanguineous N/A N/A Exudate Color: red, brown N/A N/A Granulation Amount: Large (67-100%) N/A N/A Granulation Quality: Red, Pink N/A N/A Necrotic Amount: Small (1-33%) N/A N/A Exposed Structures: Fat Layer (Subcutaneous Tissue): N/A N/A Yes Fascia: No Tendon: No Muscle:  No Joint: No Bone: No Epithelialization: None N/A N/A Treatment Notes Electronic Signature(s) Signed: 07/10/2021 10:19:34 AM By: Carlene Coria RN Entered By: Carlene Coria on 07/10/2021 08:26:47 Julia Ashley ([id number]) -------------------------------------------------------------------------------- [place] Details Patient Name: Julia Ashley Date of Service: 07/10/2021 8:00 AM Medical Record Number: [id number] Patient Account Number: [id number] Date of Birth/Sex: February 16, 1966 (55 y.o. F) Treating RN: Carlene Coria Primary Care Graciano Batson: Gemma Payor Other Clinician: Referring Yannely Kintzel: Gemma Payor Treating Ferd Horrigan/Extender: Skipper Cliche in Treatment: 4 Active Inactive Wound/Skin Impairment Nursing Diagnoses: Knowledge deficit related to ulceration/compromised skin integrity Goals: Patient/caregiver will verbalize understanding of skin care regimen Date Initiated: 06/11/2021 Date Inactivated: 06/19/2021 Target Resolution Date: 07/11/2021 Goal Status: Met Ulcer/skin breakdown will have a volume reduction of 30% by week 4 Date Initiated: 06/11/2021 Target Resolution Date: 08/11/2021 Goal Status: Active Ulcer/skin breakdown will have a volume reduction of 50% by week 8 Date Initiated: 06/11/2021 Target Resolution Date: 09/11/2021 Goal Status: Active Ulcer/skin breakdown will have a volume reduction of 80% by week 12 Date Initiated: 06/11/2021 Target Resolution Date: 10/09/2021 Goal Status: Active Ulcer/skin breakdown will heal within 14 weeks Date Initiated: 06/11/2021 Target Resolution Date: 11/09/2021 Goal Status: Active Interventions: Assess patient/caregiver ability to obtain necessary supplies Assess patient/caregiver ability to perform ulcer/skin care regimen upon admission and as needed Assess ulceration(s) every visit Notes: Electronic Signature(s) Signed: 07/10/2021 10:19:34 AM By: Carlene Coria RN Entered By: Carlene Coria on  07/10/2021 08:26:34 Julia Ashley ([id number]) -------------------------------------------------------------------------------- Pain Assessment Details Patient Name: Julia Ashley Date of Service: 07/10/2021 8:00 AM Medical Record Number: [id number] Patient Account Number: [id number] Date of Birth/Sex: May 24, 1966 (55 y.o. F) Treating RN: Carlene Coria Primary Care Lenda Baratta: Gemma Payor Other Clinician: Referring Deasha Clendenin: Gemma Payor Treating Adasia Hoar/Extender: Skipper Cliche in Treatment: 4 Active Problems Location of Pain Severity and Description of Pain Patient Has Paino No Site Locations Pain Management and Medication Current Pain Management: Electronic Signature(s) Signed: 07/10/2021 10:19:34 AM By: Carlene Coria RN Entered By: Carlene Coria on 07/10/2021 08:10:44 Julia Ashley ([id number]) -------------------------------------------------------------------------------- Patient/Caregiver Education Details Patient Name: Julia Ashley Date of Service: 07/10/2021 8:00 AM Medical Record Number: [id number] Patient Account Number: [id number] Date of Birth/Gender: [birth year] (55 y.o. F) Treating RN: Carlene Coria Primary Care Physician: Gemma Payor Other Clinician: Referring Physician: Gemma Payor Treating Physician/Extender: Skipper Cliche in Treatment: 4 Education Assessment Education Provided To: Patient Education Topics Provided Wound/Skin Impairment: Methods: Explain/Verbal Responses: State content correctly Electronic Signature(s) Signed: 07/10/2021 10:19:34 AM By: Carlene Coria RN Entered By: Carlene Coria on 07/10/2021 Julia Ashley, Julia Ashley ([id number]) -------------------------------------------------------------------------------- Wound  Assessment Details Patient Name: Julia Ashley, Julia Ashley Date of Service: 07/10/2021 8:00 AM Medical Record Number: [id number] Patient Account Number: [id number] Date of Birth/Sex: 12-17-1965 (55  y.o. F) Treating RN: Carlene Coria Primary Care Marty Sadlowski: Gemma Payor Other Clinician: Referring Holston Oyama: Gemma Payor Treating Elihu Milstein/Extender: Skipper Cliche in Treatment: 4 Wound Status Wound Number: 1 Primary Etiology: Trauma, Other Wound Location: Right, Medial Lower Leg Wound Status: Open Wounding Event: Trauma Comorbid History: Hypertension Date Acquired: 05/30/2021 Weeks Of Treatment: 4 Clustered Wound: No Photos Wound Measurements Length: (cm) 3.5 Width: (cm) 3 Depth: (cm) 0.7 Area: (cm) 8.247 Volume: (cm) 5.773 % Reduction in Area: 82.1% % Reduction in Volume: 58.1% Epithelialization: None Tunneling: No Undermining: No Wound Description Classification: Full Thickness Without Exposed Support Structures Exudate Amount: Medium Exudate Type: Serosanguineous Exudate Color: red, brown Foul Odor After Cleansing: No Slough/Fibrino Yes Wound Bed Granulation Amount: Large (67-100%) Exposed Structure Granulation Quality: Red, Pink Fascia Exposed: No Necrotic Amount: Small (1-33%) Fat Layer (Subcutaneous Tissue) Exposed: Yes Necrotic Quality: Adherent Slough Tendon Exposed: No Muscle Exposed: No Joint Exposed: No Bone Exposed: No Treatment Notes Wound #1 (Lower Leg) Wound Laterality: Right, Medial Cleanser Normal Saline Discharge Instruction: Wash your hands with soap and water. Remove old dressing, discard into plastic bag and place into trash. Cleanse the wound with Normal Saline prior to applying a clean dressing using gauze sponges, not tissues or cotton balls. Do not scrub or use excessive force. Pat dry using gauze sponges, not tissue or cotton balls. Soap and [place], [place] ([id number]) Discharge Instruction: Gently cleanse wound with antibacterial soap, rinse and pat dry prior to dressing wounds Peri-Wound Care Topical Primary Dressing Hydrofera Blue Ready Transfer Foam, 4x5 (in/in) Discharge Instruction: Cut thinner and  insert into tunnels-Apply Hydrofera Blue Ready to wound bed as directed Mepitel One Silicone Wound Contact Layer, 2x3 (in/in) Discharge Instruction: Add to wound bed to prevent sticking. Secondary Dressing Zetuvit Plus Silicone Non-bordered 5x5 (in/in) Secured With Compression Wrap Medichoice 4 layer Compression System, 35-40 mmHG Discharge Instruction: Apply multi-layer wrap as directed. Compression Stockings Add-Ons Electronic Signature(s) Signed: 07/10/2021 10:19:34 AM By: Carlene Coria RN Entered By: Carlene Coria on 07/10/2021 08:23:10 Julia Ashley ([id number]) -------------------------------------------------------------------------------- Vitals Details Patient Name: Julia Ashley Date of Service: 07/10/2021 8:00 AM Medical Record Number: [id number] Patient Account Number: [id number] Date of Birth/Sex: 04/03/1966 (55 y.o. F) Treating RN: Carlene Coria Primary Care Cross Jorge: Gemma Payor Other Clinician: Referring Andersson Larrabee: Gemma Payor Treating Jesus Nevills/Extender: Skipper Cliche in Treatment: 4 Vital Signs Time Taken: 08:10 Temperature (F): 98.2 Height (in): 72 Pulse (bpm): 85 Weight (lbs): 277 Respiratory Rate (breaths/min): 18 Body Mass Index (BMI): 37.6 Blood Pressure (mmHg): 164/99 Reference Range: 80 - 120 mg / dl Electronic Signature(s) Signed: 07/10/2021 10:19:34 AM By: Carlene Coria RN Entered By: Carlene Coria on 07/10/2021 08:10:32

## 2021-07-11 NOTE — Progress Notes (Signed)
Julia Ashley, Julia Ashley ([id number]) Visit Report for 06/26/2021 Arrival Information Details Patient Name: Julia Ashley, Julia Ashley Date of Service: 06/26/2021 8:45 AM Medical Record Number: [id number] Patient Account Number: [id number] Date of Birth/Sex: 08/15/1965 (55 y.o. F) Treating RN: Carlene Coria Primary Care Rosali Augello: Gemma Payor Other Clinician: Referring Weston Fulco: Gemma Payor Treating Yadier Bramhall/Extender: Skipper Cliche in Treatment: 2 Visit Information History Since Last Visit All ordered tests and consults were completed: No Patient Arrived: Ambulatory Added or deleted any medications: No Arrival Time: 08:52 Any new allergies or adverse reactions: No Accompanied By: self Had a fall or experienced change in No Transfer Assistance: None activities of daily living that may affect Patient Identification Verified: Yes risk of falls: Secondary Verification Process Completed: Yes Signs or symptoms of abuse/neglect since last visito No Patient Requires Transmission-Based Precautions: No Hospitalized since last visit: No Patient Has Alerts: No Implantable device outside of the clinic excluding No cellular tissue based products placed in the center since last visit: Has Dressing in Place as Prescribed: Yes Has Compression in Place as Prescribed: Yes Pain Present Now: No Electronic Signature(s) Signed: 07/11/2021 1:44:34 PM By: Carlene Coria RN Entered By: Carlene Coria on 06/26/2021 08:56:51 Julia Ashley ([id number]) -------------------------------------------------------------------------------- Clinic Level of Care Assessment Details Patient Name: Julia Ashley Date of Service: 06/26/2021 8:45 AM Medical Record Number: [id number] Patient Account Number: [id number] Date of Birth/Sex: May 09, 1966 (55 y.o. F) Treating RN: Carlene Coria Primary Care Elsie Baynes: Gemma Payor Other Clinician: Referring Fremont Skalicky: Gemma Payor Treating Shanan Fitzpatrick/Extender: Skipper Cliche in Treatment: 2 Clinic Level of Care Assessment Items TOOL 1 Quantity Score _0  - Use when EandM and Procedure is performed on INITIAL visit 0 ASSESSMENTS - Nursing Assessment / Reassessment _1  - General Physical Exam (combine w/ comprehensive assessment (listed just below) when performed on new 0 pt. evals) _2  - 0 Comprehensive Assessment (HX, ROS, Risk Assessments, Wounds Hx, etc.) ASSESSMENTS - Wound and Skin Assessment / Reassessment _3  - Dermatologic / Skin Assessment (not related to wound area) 0 ASSESSMENTS - Ostomy and/or Continence Assessment and Care _4  - Incontinence Assessment and Management 0 _5  - 0 Ostomy Care Assessment and Management (repouching, etc.) PROCESS - Coordination of Care _6  - Simple Patient / Family Education for ongoing care 0 _7  - 0 Complex (extensive) Patient / Family Education for ongoing care _8  - 0 Staff obtains Programmer, systems, Records, Test Results / Process Orders _9  - 0 Staff telephones HHA, Nursing Homes / Clarify orders / etc _10  - 0 Routine Transfer to another Facility (non-emergent condition) _11  - 0 Routine Hospital Admission (non-emergent condition) _12  - 0 New Admissions / Biomedical engineer / Ordering NPWT, Apligraf, etc. _13  - 0 Emergency Hospital Admission (emergent condition) PROCESS - Special Needs _14  - Pediatric / Minor Patient Management 0 _15  - 0 Isolation Patient Management _16  - 0 Hearing / Language / Visual special needs _17  - 0 Assessment of Community assistance (transportation, D/C planning, etc.) _18  - 0 Additional assistance / Altered mentation _19  - 0 Support Surface(s) Assessment (bed, cushion, seat, etc.) INTERVENTIONS - Miscellaneous _20  - External ear exam 0 _21  - 0 Patient Transfer (multiple staff / Civil Service fast streamer / Similar devices) _22  - 0 Simple Staple / Suture removal (25 or less) _23  - 0 Complex Staple / Suture removal (26 or more) _24  - 0 Hypo/Hyperglycemic Management (do not check if billed  separately) _25  - 0 Ankle / Brachial Index (ABI) - do not check if billed separately Has the patient been seen at the hospital within the last three years: Yes Total  Score: 0 Level Of Care: ____ Julia Ashley ([id number]) Electronic Signature(s) Signed: 07/11/2021 1:44:34 PM By: Carlene Coria RN Entered By: Carlene Coria on 06/26/2021 09:39:58 Julia Ashley ([id number]) -------------------------------------------------------------------------------- Compression Therapy Details Patient Name: Julia Ashley Date of Service: 06/26/2021 8:45 AM Medical Record Number: [id number] Patient Account Number: [id number] Date of Birth/Sex: [birth year] (55 y.o. F) Treating RN: Carlene Coria Primary Care Anokhi Shannon: Gemma Payor Other Clinician: Referring Myreon Wimer: Gemma Payor Treating Breton Berns/Extender: Skipper Cliche in Treatment: 2 Compression Therapy Performed for Wound Assessment: Wound #1 Right,Medial Lower Leg Performed By: Clinician Carlene Coria, RN Compression Type: Four Layer Post Procedure Diagnosis Same as Pre-procedure Electronic Signature(s) Signed: 07/11/2021 1:44:34 PM By: Carlene Coria RN Entered By: Carlene Coria on 06/26/2021 09:20:31 Julia Ashley ([id number]) -------------------------------------------------------------------------------- Encounter Discharge Information Details Patient Name: Julia Ashley Date of Service: 06/26/2021 8:45 AM Medical Record Number: [id number] Patient Account Number: [id number] Date of Birth/Sex: [birth year] (55 y.o. F) Treating RN: Carlene Coria Primary Care Thelia Tanksley: Gemma Payor Other Clinician: Referring Laderius Valbuena: Gemma Payor Treating Tanvir Hipple/Extender: Skipper Cliche in Treatment: 2 Encounter Discharge Information Items Discharge Condition: Stable Ambulatory Status: Ambulatory Discharge Destination: Home Transportation: Private Auto Accompanied By: self Schedule Follow-up Appointment:  Yes Clinical Summary of Care: Patient Declined Electronic Signature(s) Signed: 06/26/2021 9:41:29 AM By: Carlene Coria RN Entered By: Carlene Coria on 06/26/2021 09:41:28 Julia Ashley ([id number]) -------------------------------------------------------------------------------- Lower Extremity Assessment Details Patient Name: Julia Ashley Date of Service: 06/26/2021 8:45 AM Medical Record Number: [id number] Patient Account Number: [id number] Date of Birth/Sex: [birth year] (55 y.o. F) Treating RN: Carlene Coria Primary Care Avir Deruiter: Gemma Payor Other Clinician: Referring Jonathan Corpus: Gemma Payor Treating Karnisha Lefebre/Extender: Skipper Cliche in Treatment: 2 Edema Assessment Assessed: [Left: No] [Right: No] [Left: Edema] [Right: :] Calf Left: Right: Point of Measurement: 34 cm From Medial Instep 44 cm Ankle Left: Right: Point of Measurement: 11 cm From Medial Instep 28 cm Knee To Floor Left: Right: From Medial Instep 47 cm Electronic Signature(s) Signed: 07/11/2021 1:44:34 PM By: Carlene Coria RN Entered By: Carlene Coria on 06/26/2021 09:11:47 Julia Ashley ([id number]) -------------------------------------------------------------------------------- Multi Wound Chart Details Patient Name: Julia Ashley Date of Service: 06/26/2021 8:45 AM Medical Record Number: [id number] Patient Account Number: [id number] Date of Birth/Sex: 11/20/1965 (55 y.o. F) Treating RN: Carlene Coria Primary Care Lacresha Fusilier: Gemma Payor Other Clinician: Referring Detrice Cales: Gemma Payor Treating Harlie Ragle/Extender: Skipper Cliche in Treatment: 2 Vital Signs Height(in): 72 Pulse(bpm): 105 Weight(lbs): 277 Blood Pressure(mmHg): 168/96 Body Mass Index(BMI): 38 Temperature(F): 98.2 Respiratory Rate(breaths/min): 18 Photos: [N/A:N/A] Wound Location: Right, Medial Lower Leg N/A N/A Wounding Event: Trauma N/A N/A Primary Etiology: Trauma, Other N/A N/A Comorbid  History: Hypertension N/A N/A Date Acquired: 05/30/2021 N/A N/A Weeks of Treatment: 2 N/A N/A Wound Status: Open N/A N/A Measurements L x W x D (cm) 4x6x2 N/A N/A Area (cm) : 18.85 N/A N/A Volume (cm) : 37.699 N/A N/A % Reduction in Area: 59.00% N/A N/A % Reduction in Volume: -173.50% N/A N/A Classification: Full Thickness Without Exposed N/A N/A Support Structures Exudate Amount: Medium N/A N/A Exudate Type: Serosanguineous N/A N/A Exudate Color: red, brown N/A N/A Granulation Amount: Large (67-100%) N/A N/A Granulation Quality: Red, Pink N/A N/A Necrotic Amount: Small (1-33%) N/A N/A Exposed Structures: Fat Layer (Subcutaneous Tissue): N/A N/A Yes Fascia: No Tendon: No Muscle: No Joint: No Bone: No Epithelialization: None N/A N/A Treatment Notes Electronic Signature(s) Signed: 07/11/2021 1:44:34 PM By: Carlene Coria RN Entered By: Carlene Coria on 06/26/2021 [place] ([id number]) -------------------------------------------------------------------------------- [place] Details Patient Name: Julia Ashley  Date of Service: 06/26/2021 8:45 AM Medical Record Number: [id number] Patient Account Number: [id number] Date of Birth/Sex: May 03, 1966 (55 y.o. F) Treating RN: Carlene Coria Primary Care Kerston Landeck: Gemma Payor Other Clinician: Referring Mckenzey Parcell: Gemma Payor Treating Orine Goga/Extender: Skipper Cliche in Treatment: 2 Active Inactive Wound/Skin Impairment Nursing Diagnoses: Knowledge deficit related to ulceration/compromised skin integrity Goals: Patient/caregiver will verbalize understanding of skin care regimen Date Initiated: 06/11/2021 Date Inactivated: 06/19/2021 Target Resolution Date: 07/11/2021 Goal Status: Met Ulcer/skin breakdown will have a volume reduction of 30% by week 4 Date Initiated: 06/11/2021 Target Resolution Date: 08/11/2021 Goal Status: Active Ulcer/skin breakdown will have a volume reduction  of 50% by week 8 Date Initiated: 06/11/2021 Target Resolution Date: 09/11/2021 Goal Status: Active Ulcer/skin breakdown will have a volume reduction of 80% by week 12 Date Initiated: 06/11/2021 Target Resolution Date: 10/09/2021 Goal Status: Active Ulcer/skin breakdown will heal within 14 weeks Date Initiated: 06/11/2021 Target Resolution Date: 11/09/2021 Goal Status: Active Interventions: Assess patient/caregiver ability to obtain necessary supplies Assess patient/caregiver ability to perform ulcer/skin care regimen upon admission and as needed Assess ulceration(s) every visit Notes: Electronic Signature(s) Signed: 07/11/2021 1:44:34 PM By: Carlene Coria RN Entered By: Carlene Coria on 06/26/2021 09:17:37 Julia Ashley ([id number]) -------------------------------------------------------------------------------- Pain Assessment Details Patient Name: Julia Ashley Date of Service: 06/26/2021 8:45 AM Medical Record Number: [id number] Patient Account Number: [id number] Date of Birth/Sex: Jan 05, 1966 (55 y.o. F) Treating RN: Carlene Coria Primary Care Ryleigh Buenger: Gemma Payor Other Clinician: Referring Julyssa Kyer: Gemma Payor Treating Ahniya Mitchum/Extender: Skipper Cliche in Treatment: 2 Active Problems Location of Pain Severity and Description of Pain Patient Has Paino No Site Locations Pain Management and Medication Current Pain Management: Electronic Signature(s) Signed: 07/11/2021 1:44:34 PM By: Carlene Coria RN Entered By: Carlene Coria on 06/26/2021 08:57:26 Julia Ashley ([id number]) -------------------------------------------------------------------------------- Patient/Caregiver Education Details Patient Name: Julia Ashley Date of Service: 06/26/2021 8:45 AM Medical Record Number: [id number] Patient Account Number: [id number] Date of Birth/Gender: 12-Apr-1966 (55 y.o. F) Treating RN: Carlene Coria Primary Care Physician: Gemma Payor Other  Clinician: Referring Physician: Gemma Payor Treating Physician/Extender: Skipper Cliche in Treatment: 2 Education Assessment Education Provided To: Patient Education Topics Provided Wound/Skin Impairment: Methods: Explain/Verbal Responses: State content correctly Electronic Signature(s) Signed: 07/11/2021 1:44:34 PM By: Carlene Coria RN Entered By: Carlene Coria on 06/26/2021 09:40:44 Julia Ashley ([id number]) -------------------------------------------------------------------------------- Wound Assessment Details Patient Name: Julia Ashley Date of Service: 06/26/2021 8:45 AM Medical Record Number: [id number] Patient Account Number: [id number] Date of Birth/Sex: 11-02-1965 (55 y.o. F) Treating RN: Carlene Coria Primary Care Abbigaile Rockman: Gemma Payor Other Clinician: Referring Angelgabriel Willmore: Gemma Payor Treating Kynzi Levay/Extender: Skipper Cliche in Treatment: 2 Wound Status Wound Number: 1 Primary Etiology: Trauma, Other Wound Location: Right, Medial Lower Leg Wound Status: Open Wounding Event: Trauma Comorbid History: Hypertension Date Acquired: 05/30/2021 Weeks Of Treatment: 2 Clustered Wound: No Photos Wound Measurements Length: (cm) 4 Width: (cm) 6 Depth: (cm) 2 Area: (cm) 18.85 Volume: (cm) 37.699 % Reduction in Area: 59% % Reduction in Volume: -173.5% Epithelialization: None Tunneling: No Undermining: No Wound Description Classification: Full Thickness Without Exposed Support Structu Exudate Amount: Medium Exudate Type: Serosanguineous Exudate Color: red, brown res Foul Odor After Cleansing: No Slough/Fibrino Yes Wound Bed Granulation Amount: Large (67-100%) Exposed Structure Granulation Quality: Red, Pink Fascia Exposed: No Necrotic Amount: Small (1-33%) Fat Layer (Subcutaneous Tissue) Exposed: Yes Necrotic Quality: Adherent Slough Tendon Exposed: No Muscle Exposed: No Joint Exposed: No Bone Exposed: No Electronic  Signature(s) Signed: 07/11/2021 1:44:34 PM By: Carlene Coria RN Entered By: Carlene Coria on 06/26/2021 09:10:48  [place], [place] ([id number]) -------------------------------------------------------------------------------- [place] Details Patient Name: Julia Ashley, Julia Ashley Date of Service: 06/26/2021 8:45 AM Medical Record Number: [id number] Patient Account Number: [id number] Date of Birth/Sex: 27-May-1966 (55 y.o. F) Treating RN: Carlene Coria Primary Care Harrietta Incorvaia: Gemma Payor Other Clinician: Referring Tylene Quashie: Gemma Payor Treating Deaveon Schoen/Extender: Skipper Cliche in Treatment: 2 Vital Signs Time Taken: 08:56 Temperature (F): 98.2 Height (in): 72 Pulse (bpm): 105 Weight (lbs): 277 Respiratory Rate (breaths/min): 18 Body Mass Index (BMI): 37.6 Blood Pressure (mmHg): 168/96 Reference Range: 80 - 120 mg / dl Electronic Signature(s) Signed: 07/11/2021 1:44:34 PM By: Carlene Coria RN Entered By: Carlene Coria on 06/26/2021 08:57:14

## 2021-07-17 ENCOUNTER — Encounter: Payer: BC Managed Care – PPO | Admitting: Physician Assistant

## 2021-07-17 ENCOUNTER — Other Ambulatory Visit: Payer: Self-pay

## 2021-07-17 DIAGNOSIS — S81811A Laceration without foreign body, right lower leg, initial encounter: Secondary | ICD-10-CM | POA: Diagnosis not present

## 2021-07-17 DIAGNOSIS — I10 Essential (primary) hypertension: Secondary | ICD-10-CM | POA: Diagnosis not present

## 2021-07-17 DIAGNOSIS — L97812 Non-pressure chronic ulcer of other part of right lower leg with fat layer exposed: Secondary | ICD-10-CM | POA: Diagnosis not present

## 2021-07-17 DIAGNOSIS — W228XXA Striking against or struck by other objects, initial encounter: Secondary | ICD-10-CM | POA: Diagnosis not present

## 2021-07-18 NOTE — Progress Notes (Signed)
Julia Ashley, Julia Ashley ([id number]) Visit Report for 07/17/2021 Arrival Information Details Patient Name: Julia Ashley, Julia Ashley Date of Service: 07/17/2021 8:00 AM Medical Record Number: [id number] Patient Account Number: [id number] Date of Birth/Sex: 1965/08/17 (55 y.o. F) Treating RN: Levora Dredge Primary Care Damonica Chopra: Gemma Payor Other Clinician: Referring Laquan Beier: Gemma Payor Treating Calbert Hulsebus/Extender: Skipper Cliche in Treatment: 5 Visit Information History Since Last Visit Added or deleted any medications: No Patient Arrived: Ambulatory Any new allergies or adverse reactions: No Arrival Time: 08:10 Had a fall or experienced change in No Accompanied By: self activities of daily living that may affect Transfer Assistance: None risk of falls: Patient Identification Verified: Yes Hospitalized since last visit: No Secondary Verification Process Completed: Yes Has Dressing in Place as Prescribed: Yes Patient Requires Transmission-Based Precautions: No Has Compression in Place as Prescribed: Yes Patient Has Alerts: No Has Footwear/Offloading in Place as Prescribed: No Pain Present Now: No Electronic Signature(s) Signed: 07/18/2021 4:05:40 PM By: Levora Dredge Entered By: Levora Dredge on 07/17/2021 08:12:15 Julia Ashley ([id number]) -------------------------------------------------------------------------------- Clinic Level of Care Assessment Details Patient Name: Julia Ashley Date of Service: 07/17/2021 8:00 AM Medical Record Number: [id number] Patient Account Number: [id number] Date of Birth/Sex: 01/21/1966 (55 y.o. F) Treating RN: Levora Dredge Primary Care Kirstan Fentress: Gemma Payor Other Clinician: Referring Shaquel Chavous: Gemma Payor Treating Larken Urias/Extender: Skipper Cliche in Treatment: 5 Clinic Level of Care Assessment Items TOOL 1 Quantity Score '[]'  - Use when EandM and Procedure is performed on INITIAL visit 0 ASSESSMENTS -  Nursing Assessment / Reassessment '[]'  - General Physical Exam (combine w/ comprehensive assessment (listed just below) when performed on new 0 pt. evals) '[]'  - 0 Comprehensive Assessment (HX, ROS, Risk Assessments, Wounds Hx, etc.) ASSESSMENTS - Wound and Skin Assessment / Reassessment '[]'  - Dermatologic / Skin Assessment (not related to wound area) 0 ASSESSMENTS - Ostomy and/or Continence Assessment and Care '[]'  - Incontinence Assessment and Management 0 '[]'  - 0 Ostomy Care Assessment and Management (repouching, etc.) PROCESS - Coordination of Care '[]'  - Simple Patient / Family Education for ongoing care 0 '[]'  - 0 Complex (extensive) Patient / Family Education for ongoing care '[]'  - 0 Staff obtains Programmer, systems, Records, Test Results / Process Orders '[]'  - 0 Staff telephones HHA, Nursing Homes / Clarify orders / etc '[]'  - 0 Routine Transfer to another Facility (non-emergent condition) '[]'  - 0 Routine Hospital Admission (non-emergent condition) '[]'  - 0 New Admissions / Biomedical engineer / Ordering NPWT, Apligraf, etc. '[]'  - 0 Emergency Hospital Admission (emergent condition) PROCESS - Special Needs '[]'  - Pediatric / Minor Patient Management 0 '[]'  - 0 Isolation Patient Management '[]'  - 0 Hearing / Language / Visual special needs '[]'  - 0 Assessment of Community assistance (transportation, D/C planning, etc.) '[]'  - 0 Additional assistance / Altered mentation '[]'  - 0 Support Surface(s) Assessment (bed, cushion, seat, etc.) INTERVENTIONS - Miscellaneous '[]'  - External ear exam 0 '[]'  - 0 Patient Transfer (multiple staff / Civil Service fast streamer / Similar devices) '[]'  - 0 Simple Staple / Suture removal (25 or less) '[]'  - 0 Complex Staple / Suture removal (26 or more) '[]'  - 0 Hypo/Hyperglycemic Management (do not check if billed separately) '[]'  - 0 Ankle / Brachial Index (ABI) - do not check if billed separately Has the patient been seen at the hospital within the last three years: Yes Total Score:  0 Level Of Care: ____ Julia Ashley ([id number]) Electronic Signature(s) Signed: 07/18/2021 4:05:40 PM By: Levora Dredge Entered By: Levora Dredge on 07/17/2021 08:41:33 Julia Ashley, Julia Ashley ([id number]) -------------------------------------------------------------------------------- Compression  Therapy Details Patient Name: Julia Ashley, Julia Ashley Date of Service: 07/17/2021 8:00 AM Medical Record Number: [id number] Patient Account Number: [id number] Date of Birth/Sex: [birth year] (55 y.o. F) Treating RN: Levora Dredge Primary Care Kenny Rea: Gemma Payor Other Clinician: Referring Jeromie Gainor: Gemma Payor Treating Jamiah Homeyer/Extender: Skipper Cliche in Treatment: 5 Compression Therapy Performed for Wound Assessment: Wound #1 Right,Medial Lower Leg Performed By: Clinician Levora Dredge, RN Compression Type: Four Layer Post Procedure Diagnosis Same as Pre-procedure Electronic Signature(s) Signed: 07/18/2021 4:05:40 PM By: Levora Dredge Entered By: Levora Dredge on 07/17/2021 08:40:31 Julia Ashley ([id number]) -------------------------------------------------------------------------------- Encounter Discharge Information Details Patient Name: Julia Ashley Date of Service: 07/17/2021 8:00 AM Medical Record Number: [id number] Patient Account Number: [id number] Date of Birth/Sex: 01/09/1966 (55 y.o. F) Treating RN: Carlene Coria Primary Care Taneshia Lorence: Gemma Payor Other Clinician: Referring Jaelie Aguilera: Gemma Payor Treating Shawan Tosh/Extender: Skipper Cliche in Treatment: 5 Encounter Discharge Information Items Discharge Condition: Stable Ambulatory Status: Ambulatory Discharge Destination: Home Transportation: Private Auto Accompanied By: self Schedule Follow-up Appointment: Yes Clinical Summary of Care: Patient Declined Electronic Signature(s) Signed: 07/18/2021 4:05:54 PM By: Carlene Coria RN Entered By: Carlene Coria on 07/17/2021  10:30:53 Julia Ashley ([id number]) -------------------------------------------------------------------------------- Lower Extremity Assessment Details Patient Name: Julia Ashley Date of Service: 07/17/2021 8:00 AM Medical Record Number: [id number] Patient Account Number: [id number] Date of Birth/Sex: 05/03/1966 (55 y.o. F) Treating RN: Levora Dredge Primary Care Yao Hyppolite: Gemma Payor Other Clinician: Referring Ranae Casebier: Gemma Payor Treating Mahalie Kanner/Extender: Skipper Cliche in Treatment: 5 Edema Assessment Assessed: [Left: No] [Right: No] Edema: [Left: Ye] [Right: s] Calf Left: Right: Point of Measurement: 34 cm From Medial Instep 43 cm Ankle Left: Right: Point of Measurement: 11 cm From Medial Instep 25 cm Vascular Assessment Pulses: Dorsalis Pedis Palpable: [Right:Yes] Electronic Signature(s) Signed: 07/18/2021 4:05:40 PM By: Levora Dredge Entered By: Levora Dredge on 07/17/2021 08:32:30 Julia Ashley ([id number]) -------------------------------------------------------------------------------- Multi Wound Chart Details Patient Name: Julia Ashley Date of Service: 07/17/2021 8:00 AM Medical Record Number: [id number] Patient Account Number: [id number] Date of Birth/Sex: [birth year] (55 y.o. F) Treating RN: Levora Dredge Primary Care Darwyn Ponzo: Gemma Payor Other Clinician: Referring Antwyne Pingree: Gemma Payor Treating Thurmon Mizell/Extender: Skipper Cliche in Treatment: 5 Vital Signs Height(in): 72 Pulse(bpm): 87 Weight(lbs): 491 Blood Pressure(mmHg): 151/87 Body Mass Index(BMI): 38 Temperature(F): 98.2 Respiratory Rate(breaths/min): 18 Photos: [N/A:N/A] Wound Location: Right, Medial Lower Leg N/A N/A Wounding Event: Trauma N/A N/A Primary Etiology: Trauma, Other N/A N/A Comorbid History: Hypertension N/A N/A Date Acquired: 05/30/2021 N/A N/A Weeks of Treatment: 5 N/A N/A Wound Status: Open N/A N/A Measurements L x  W x D (cm) 3x2.5x0.4 N/A N/A Area (cm) : 5.89 N/A N/A Volume (cm) : 2.356 N/A N/A % Reduction in Area: 87.20% N/A N/A % Reduction in Volume: 82.90% N/A N/A Classification: Full Thickness Without Exposed N/A N/A Support Structures Exudate Amount: Medium N/A N/A Exudate Type: Serosanguineous N/A N/A Exudate Color: red, brown N/A N/A Granulation Amount: Large (67-100%) N/A N/A Granulation Quality: Red, Pink N/A N/A Necrotic Amount: Small (1-33%) N/A N/A Exposed Structures: Fat Layer (Subcutaneous Tissue): N/A N/A Yes Fascia: No Tendon: No Muscle: No Joint: No Bone: No Epithelialization: None N/A N/A Treatment Notes Electronic Signature(s) Signed: 07/18/2021 4:05:40 PM By: Levora Dredge Entered By: Levora Dredge on 07/17/2021 08:39:45 Julia Ashley ([id number]) -------------------------------------------------------------------------------- [place] Details Patient Name: Julia Ashley Date of Service: 07/17/2021 8:00 AM Medical Record Number: [id number] Patient Account Number: [id number] Date of Birth/Sex: 21-Apr-1966 (55 y.o. F) Treating RN: Levora Dredge Primary Care Rudell Ortman: Gemma Payor Other Clinician: Referring Jhayla Podgorski: Gemma Payor  Treating Otis Portal/Extender: Jeri Cos Weeks in Treatment: 5 Active Inactive Wound/Skin Impairment Nursing Diagnoses: Knowledge deficit related to ulceration/compromised skin integrity Goals: Patient/caregiver will verbalize understanding of skin care regimen Date Initiated: 06/11/2021 Date Inactivated: 06/19/2021 Target Resolution Date: 07/11/2021 Goal Status: Met Ulcer/skin breakdown will have a volume reduction of 30% by week 4 Date Initiated: 06/11/2021 Target Resolution Date: 08/11/2021 Goal Status: Active Ulcer/skin breakdown will have a volume reduction of 50% by week 8 Date Initiated: 06/11/2021 Target Resolution Date: 09/11/2021 Goal Status: Active Ulcer/skin breakdown will have a  volume reduction of 80% by week 12 Date Initiated: 06/11/2021 Target Resolution Date: 10/09/2021 Goal Status: Active Ulcer/skin breakdown will heal within 14 weeks Date Initiated: 06/11/2021 Target Resolution Date: 11/09/2021 Goal Status: Active Interventions: Assess patient/caregiver ability to obtain necessary supplies Assess patient/caregiver ability to perform ulcer/skin care regimen upon admission and as needed Assess ulceration(s) every visit Notes: Electronic Signature(s) Signed: 07/18/2021 4:05:40 PM By: Levora Dredge Entered By: Levora Dredge on 07/17/2021 08:39:28 Julia Ashley ([id number]) -------------------------------------------------------------------------------- Pain Assessment Details Patient Name: Julia Ashley Date of Service: 07/17/2021 8:00 AM Medical Record Number: [id number] Patient Account Number: [id number] Date of Birth/Sex: 01/30/1966 (55 y.o. F) Treating RN: Levora Dredge Primary Care Jamarius Saha: Gemma Payor Other Clinician: Referring [place] Savini: Gemma Payor Treating Lilian Fuhs/Extender: Skipper Cliche in Treatment: 5 Active Problems Location of Pain Severity and Description of Pain Patient Has Paino No Site Locations Rate the pain. Current Pain Level: 0 Pain Management and Medication Current Pain Management: Electronic Signature(s) Signed: 07/18/2021 4:05:40 PM By: Levora Dredge Entered By: Levora Dredge on 07/17/2021 08:12:45 Julia Ashley ([id number]) -------------------------------------------------------------------------------- Patient/Caregiver Education Details Patient Name: Julia Ashley Date of Service: 07/17/2021 8:00 AM Medical Record Number: [id number] Patient Account Number: [id number] Date of Birth/Gender: 02/04/1966 (55 y.o. F) Treating RN: Levora Dredge Primary Care Physician: Gemma Payor Other Clinician: Referring Physician: Gemma Payor Treating Physician/Extender: Skipper Cliche in Treatment: 5 Education Assessment Education Provided To: Patient Education Topics Provided Wound/Skin Impairment: Methods: Explain/Verbal Responses: State content correctly Electronic Signature(s) Signed: 07/18/2021 4:05:40 PM By: Levora Dredge Entered By: Levora Dredge on 07/17/2021 08:41:51 Julia Ashley ([id number]) -------------------------------------------------------------------------------- Wound Assessment Details Patient Name: Julia Ashley Date of Service: 07/17/2021 8:00 AM Medical Record Number: [id number] Patient Account Number: [id number] Date of Birth/Sex: 06/23/1966 (55 y.o. F) Treating RN: Levora Dredge Primary Care Sammy Douthitt: Gemma Payor Other Clinician: Referring Yuleni Burich: Gemma Payor Treating Ramel Tobon/Extender: Skipper Cliche in Treatment: 5 Wound Status Wound Number: 1 Primary Etiology: Trauma, Other Wound Location: Right, Medial Lower Leg Wound Status: Open Wounding Event: Trauma Comorbid History: Hypertension Date Acquired: 05/30/2021 Weeks Of Treatment: 5 Clustered Wound: No Photos Wound Measurements Length: (cm) 3 Width: (cm) 2.5 Depth: (cm) 0.4 Area: (cm) 5.89 Volume: (cm) 2.356 % Reduction in Area: 87.2% % Reduction in Volume: 82.9% Epithelialization: None Wound Description Classification: Full Thickness Without Exposed Support Structures Exudate Amount: Medium Exudate Type: Serosanguineous Exudate Color: red, brown Foul Odor After Cleansing: No Slough/Fibrino Yes Wound Bed Granulation Amount: Large (67-100%) Exposed Structure Granulation Quality: Red, Pink Fascia Exposed: No Necrotic Amount: Small (1-33%) Fat Layer (Subcutaneous Tissue) Exposed: Yes Necrotic Quality: Adherent Slough Tendon Exposed: No Muscle Exposed: No Joint Exposed: No Bone Exposed: No Treatment Notes Wound #1 (Lower Leg) Wound Laterality: Right, Medial Cleanser Normal Saline Discharge Instruction: Wash your  hands with soap and water. Remove old dressing, discard into plastic bag and place into trash. Cleanse the wound with Normal Saline prior to applying a clean dressing using gauze sponges, not tissues or cotton balls. Do  not scrub or use excessive force. Pat dry using gauze sponges, not tissue or cotton balls. Soap and [place], [place] ([id number]) Discharge Instruction: Gently cleanse wound with antibacterial soap, rinse and pat dry prior to dressing wounds [place] Discharge Instruction: Apply AandD Ointment as directed Topical Primary Dressing Prisma 4.34 (in) Discharge Instruction: Moisten w/normal saline or sterile water; Cover wound as directed. Do not remove from wound bed. Secondary Dressing ABD Pad 5x9 (in/in) Discharge Instruction: Cover with ABD pad Secured With Compression Wrap Medichoice 4 layer Compression System, 35-40 mmHG Discharge Instruction: Apply multi-layer wrap as directed. Compression Stockings Add-Ons Electronic Signature(s) Signed: 07/18/2021 4:05:40 PM By: Levora Dredge Entered By: Levora Dredge on 07/17/2021 08:31:14 Julia Ashley ([id number]) -------------------------------------------------------------------------------- Vitals Details Patient Name: Julia Ashley Date of Service: 07/17/2021 8:00 AM Medical Record Number: [id number] Patient Account Number: [id number] Date of Birth/Sex: 1966/07/16 (55 y.o. F) Treating RN: Levora Dredge Primary Care Ahrianna Siglin: Gemma Payor Other Clinician: Referring Hardeep Reetz: Gemma Payor Treating Ngoc Detjen/Extender: Skipper Cliche in Treatment: 5 Vital Signs Time Taken: 08:10 Temperature (F): 98.2 Height (in): 72 Pulse (bpm): 87 Weight (lbs): 277 Respiratory Rate (breaths/min): 18 Body Mass Index (BMI): 37.6 Blood Pressure (mmHg): 151/87 Reference Range: 80 - 120 mg / dl Electronic Signature(s) Signed: 07/18/2021 4:05:40 PM By: Levora Dredge Entered By:  Levora Dredge on 07/17/2021 08:12:35

## 2021-07-18 NOTE — Progress Notes (Signed)
Julia Ashley, Julia Ashley ([id number]) Visit Report for 07/17/2021 Chief Complaint Document Details Patient Name: Julia Ashley, Julia Ashley Date of Service: 07/17/2021 8:00 AM Medical Record Number: [id number] Patient Account Number: [id number] Date of Birth/Sex: June 15, 1966 (55 y.o. F) Treating RN: Yevonne Pax Primary Care Provider: Aurora Mask Other Clinician: Referring Provider: Aurora Mask Treating Provider/Extender: Rowan Blase in Treatment: 5 Information Obtained from: Patient Chief Complaint Laceration right leg Electronic Signature(s) Signed: 07/17/2021 5:37:18 PM By: Lenda Kelp PA-C Entered By: Lenda Kelp on 07/17/2021 08:25:29 Julia Ashley ([id number]) -------------------------------------------------------------------------------- HPI Details Patient Name: Julia Ashley Date of Service: 07/17/2021 8:00 AM Medical Record Number: [id number] Patient Account Number: [id number] Date of Birth/Sex: September 05, 1965 (55 y.o. F) Treating RN: Yevonne Pax Primary Care Provider: Aurora Mask Other Clinician: Referring Provider: Aurora Mask Treating Provider/Extender: Rowan Blase in Treatment: 5 History of Present Illness HPI Description: 06/11/2021 upon evaluation today patient presents for initial inspection here in the clinic concerning an issue that she had with a laceration on her right leg that occurred on May 30, 2021. This was when she was on a cruise they were actually in the [place] and they were on land off ship when this occurred she is not even sure what she hit on the scooter but nonetheless had a significant laceration. Subsequently her husband used a bungee cord to tourniquet the leg she tells me that it hurts severely. Nonetheless when she gets to the ship they actually did attempt to suture this back to the area that was sutured back appears to have survived the other half did not. Rating to remove the sutures today and then  subsequently cleaned away any of the dead necrotic tissue. Fortunately I do not see any signs of active infection systemically at this point. That is great news overall locally I think that infection is definitely something that we will get a need to consider here. Especially with the significant debridement like we are doing I will probably put her on a prophylactic antibiotic just to be on the safe side. I discussed that with the patient today as well. She has a history of hypertension but no other major medical problems. She does seem to bruise and bleed easily however. She has no history of any hematologic conditions. 06/19/2021 upon evaluation today patient's wound is actually showing signs of improvement. I am actually very pleased with where we stand and I think she is making progress. This again is a significant wound that occurred and I saw her for the first time last week overall it is good to take a bit of healing to fill in the center part of the wound where this is much deeper but nonetheless I believe that this is achievable. 06/26/2021 upon evaluation today patient appears to be making good progress. This is still a significant wound and is going to take a bit of time to get it where we want to have it. Nonetheless I think she is definitely making excellent improvements as we stand here currently. Fortunately I do not see any evidence of active infection systemically at this point which is great news. Locally is also doing great. She does have a little blister on the inside of her foot that seems to be rubbing I think this is due to the wrap and the fact that it is really tight getting into the crocs. 07/03/2021 upon evaluation today patient appears to be doing well with regard to her wound. This is measuring smaller which is good with that being said there  is some dry skin around the edge of [place] work on just a little bit. Also think that she seems to be doing well with regard to  the depth of the center part of the wound where she has a much deeper area here occurring. Nonetheless I think that we probably need to go ahead and see about packing this slightly less with regard to the [facility] so that it has room to grow again. Otherwise I think things are doing decently well. 07/10/2021 upon evaluation today patient's wound is actually showing signs of excellent improvement and very pleased with this. His actually making great progress and the wound bed itself is looking better not measuring his deep in the center part as well. 07/17/2021 upon evaluation today patient's wound is actually showing signs of excellent improvement. Fortunately there does not appear to be any evidence of active infection which is great news. No fevers, chills, nausea, vomiting, or diarrhea. Electronic Signature(s) Signed: 07/17/2021 8:55:50 AM By: Lenda Kelp PA-C Entered By: Lenda Kelp on 07/17/2021 08:55:50 Julia Ashley ([id number]) -------------------------------------------------------------------------------- Physical Exam Details Patient Name: Julia Ashley Date of Service: 07/17/2021 8:00 AM Medical Record Number: [id number] Patient Account Number: [id number] Date of Birth/Sex: 29-Dec-1965 (55 y.o. F) Treating RN: Yevonne Pax Primary Care Provider: Aurora Mask Other Clinician: Referring Provider: Aurora Mask Treating Provider/Extender: Rowan Blase in Treatment: 5 Constitutional Well-nourished and well-hydrated in no acute distress. Respiratory normal breathing without difficulty. Psychiatric this patient is able to make decisions and demonstrates good insight into disease process. Alert and Oriented x 3. pleasant and cooperative. Notes Upon inspection patient's wound bed showed signs of good granulation epithelization at this point. Fortunately I do not see any evidence of active infection locally nor systemically at this point and overall  I think she is making good progress. The Hydrofera [employer] is Sticking to the wound bed for that reason I Ernie Hew see what I can do about trying to improve the overall status of the wound by switching over to silver collagen to see if this will can to keep things in a little bit better here for [place]. Electronic Signature(s) Signed: 07/17/2021 8:56:34 AM By: Lenda Kelp PA-C Entered By: Lenda Kelp on 07/17/2021 08:56:34 Julia Ashley ([id number]) -------------------------------------------------------------------------------- Physician Orders Details Patient Name: Julia Ashley Date of Service: 07/17/2021 8:00 AM Medical Record Number: [id number] Patient Account Number: [id number] Date of Birth/Sex: November 08, 1965 (55 y.o. F) Treating RN: Angelina Pih Primary Care Provider: Aurora Mask Other Clinician: Referring Provider: Aurora Mask Treating Provider/Extender: Rowan Blase in Treatment: 5 Verbal / Phone Orders: No Diagnosis Coding ICD-10 Coding Code Description [phone] Laceration without foreign body, right lower leg, initial encounter L97.812 Non-pressure chronic ulcer of other part of right lower leg with fat layer exposed I10 Essential (primary) hypertension Follow-up Appointments o Return Appointment in 1 week. - provider once a week (change wrap twice weekly) o Nurse Visit as needed - once a week (change wrap twice weekly) Bathing/ Shower/ Hygiene o May shower with wound dressing protected with water repellent cover or cast protector. o No tub bath. Edema Control - Lymphedema / Segmental Compressive Device / Other o Optional: One layer of unna paste to top of compression wrap (to act as an anchor). o Elevate, Exercise Daily and Avoid Standing for Long Periods of Time. o Elevate legs to the level of the heart and pump ankles as often as possible o Elevate leg(s) parallel to the floor when sitting. Additional Orders / Instructions o  Follow Nutritious Diet and Increase Protein Intake Wound Treatment Wound #1 - Lower Leg Wound Laterality: Right, Medial Cleanser: Normal Saline 2 x Per Week/30 Days Discharge Instructions: Wash your hands with soap and water. Remove old dressing, discard into plastic bag and place into trash. Cleanse the wound with Normal Saline prior to applying a clean dressing using gauze sponges, not tissues or cotton balls. Do not scrub or use excessive force. Pat dry using gauze sponges, not tissue or cotton balls. Cleanser: Soap and Water 2 x Per Week/30 Days Discharge Instructions: Gently cleanse wound with antibacterial soap, rinse and pat dry prior to dressing wounds Julia Ashley-Wound Care: AandD Ointment 2 x Per Week/30 Days Discharge Instructions: Apply AandD Ointment as directed Primary Dressing: Prisma 4.34 (in) 2 x Per Week/30 Days Discharge Instructions: Moisten w/normal saline or sterile water; Cover wound as directed. Do not remove from wound bed. Secondary Dressing: ABD Pad 5x9 (in/in) 2 x Per Week/30 Days Discharge Instructions: Cover with ABD pad Compression Wrap: Medichoice 4 layer Compression System, 35-40 mmHG 2 x Per Week/30 Days Discharge Instructions: Apply multi-layer wrap as directed. Electronic Signature(s) Signed: 07/17/2021 5:37:18 PM By: Lenda Kelp PA-C Signed: 07/18/2021 4:05:40 PM By: Angelina Pih Entered By: Angelina Pih on 07/17/2021 08:41:27 Julia Ashley ([id number]) -------------------------------------------------------------------------------- Problem List Details Patient Name: Julia Ashley Date of Service: 07/17/2021 8:00 AM Medical Record Number: [id number] Patient Account Number: [id number] Date of Birth/Sex: May 20, 1966 (56 y.o. F) Treating RN: Yevonne Pax Primary Care Provider: Aurora Mask Other Clinician: Referring Provider: Aurora Mask Treating Provider/Extender: Rowan Blase in Treatment: 5 Active  Problems ICD-10 Encounter Code Description Active Date MDM Diagnosis S81.811A Laceration without foreign body, right lower leg, initial encounter 06/11/2021 No Yes L97.812 Non-pressure chronic ulcer of other part of right lower leg with fat layer 06/11/2021 No Yes exposed I10 Essential (primary) hypertension 06/11/2021 No Yes Inactive Problems Resolved Problems Electronic Signature(s) Signed: 07/17/2021 5:37:18 PM By: Lenda Kelp PA-C Entered By: Lenda Kelp on 07/17/2021 08:25:25 Julia Ashley ([id number]) -------------------------------------------------------------------------------- Progress Note Details Patient Name: Julia Ashley Date of Service: 07/17/2021 8:00 AM Medical Record Number: [id number] Patient Account Number: [id number] Date of Birth/Sex: Jun 16, 1966 (55 y.o. F) Treating RN: Yevonne Pax Primary Care Provider: Aurora Mask Other Clinician: Referring Provider: Aurora Mask Treating Provider/Extender: Rowan Blase in Treatment: 5 Subjective Chief Complaint Information obtained from Patient Laceration right leg History of Present Illness (HPI) 06/11/2021 upon evaluation today patient presents for initial inspection here in the clinic concerning an issue that she had with a laceration on her right leg that occurred on May 30, 2021. This was when she was on a cruise they were actually in the [place] and they were on land off ship when this occurred she is not even sure what she hit on the scooter but nonetheless had a significant laceration. Subsequently her husband used a bungee cord to tourniquet the leg she tells me that it hurts severely. Nonetheless when she gets to the ship they actually did attempt to suture this back to the area that was sutured back appears to have survived the other half did not. Rating to remove the sutures today and then subsequently cleaned away any of the dead necrotic tissue. Fortunately I do not see any  signs of active infection systemically at this point. That is great news overall locally I think that infection is definitely something that we will get a need to consider here. Especially with the significant debridement like we are doing I will probably  put her on a prophylactic antibiotic just to be on the safe side. I discussed that with the patient today as well. She has a history of hypertension but no other major medical problems. She does seem to bruise and bleed easily however. She has no history of any hematologic conditions. 06/19/2021 upon evaluation today patient's wound is actually showing signs of improvement. I am actually very pleased with where we stand and I think she is making progress. This again is a significant wound that occurred and I saw her for the first time last week overall it is good to take a bit of healing to fill in the center part of the wound where this is much deeper but nonetheless I believe that this is achievable. 06/26/2021 upon evaluation today patient appears to be making good progress. This is still a significant wound and is going to take a bit of time to get it where we want to have it. Nonetheless I think she is definitely making excellent improvements as we stand here currently. Fortunately I do not see any evidence of active infection systemically at this point which is great news. Locally is also doing great. She does have a little blister on the inside of her foot that seems to be rubbing I think this is due to the wrap and the fact that it is really tight getting into the crocs. 07/03/2021 upon evaluation today patient appears to be doing well with regard to her wound. This is measuring smaller which is good with that being said there is some dry skin around the edge of [place] work on just a little bit. Also think that she seems to be doing well with regard to the depth of the center part of the wound where she has a much deeper area here occurring.  Nonetheless I think that we probably need to go ahead and see about packing this slightly less with regard to the [facility] so that it has room to grow again. Otherwise I think things are doing decently well. 07/10/2021 upon evaluation today patient's wound is actually showing signs of excellent improvement and very pleased with this. His actually making great progress and the wound bed itself is looking better not measuring his deep in the center part as well. 07/17/2021 upon evaluation today patient's wound is actually showing signs of excellent improvement. Fortunately there does not appear to be any evidence of active infection which is great news. No fevers, chills, nausea, vomiting, or diarrhea. Objective Constitutional Well-nourished and well-hydrated in no acute distress. Vitals Time Taken: 8:10 AM, Height: 72 in, Weight: 277 lbs, BMI: 37.6, Temperature: 98.2 F, Pulse: 87 bpm, Respiratory Rate: 18 breaths/min, Blood Pressure: 151/87 mmHg. Respiratory normal breathing without difficulty. Psychiatric this patient is able to make decisions and demonstrates good insight into disease process. Alert and Oriented x 3. pleasant and cooperative. General Notes: Upon inspection patient's wound bed showed signs of good granulation epithelization at this point. Fortunately I do not see any evidence of active infection locally nor systemically at this point and overall I think she is making good progress. The [facility] is Sticking to [place], Julia Ashley ([id number]) the wound bed for that reason I Ernie Hew see what I can do about trying to improve the overall status of the wound by switching over to silver collagen to see if this will can to keep things in a little bit better here for [place]. Integumentary (Hair, Skin) Wound #1 status is Open. Original cause of wound was  Trauma. The date acquired was: 05/30/2021. The wound has been in treatment 5 weeks. The wound is located on the Right,Medial Lower  Leg. The wound measures 3cm length x 2.5cm width x 0.4cm depth; 5.89cm^2 area and 2.356cm^3 volume. There is Fat Layer (Subcutaneous Tissue) exposed. There is a medium amount of serosanguineous drainage noted. There is large (67-100%) red, pink granulation within the wound bed. There is a small (1-33%) amount of necrotic tissue within the wound bed including Adherent Slough. Assessment Active Problems ICD-10 Laceration without foreign body, right lower leg, initial encounter Non-pressure chronic ulcer of other part of right lower leg with fat layer exposed Essential (primary) hypertension Procedures Wound #1 Pre-procedure diagnosis of Wound #1 is a Trauma, Other located on the Right,Medial Lower Leg . There was a Four Layer Compression Therapy Procedure by Angelina Pih, RN. Post procedure Diagnosis Wound #1: Same as Pre-Procedure Plan Follow-up Appointments: Return Appointment in 1 week. - provider once a week (change wrap twice weekly) Nurse Visit as needed - once a week (change wrap twice weekly) Bathing/ Shower/ Hygiene: May shower with wound dressing protected with water repellent cover or cast protector. No tub bath. Edema Control - Lymphedema / Segmental Compressive Device / Other: Optional: One layer of unna paste to top of compression wrap (to act as an anchor). Elevate, Exercise Daily and Avoid Standing for Long Periods of Time. Elevate legs to the level of the heart and pump ankles as often as possible Elevate leg(s) parallel to the floor when sitting. Additional Orders / Instructions: Follow Nutritious Diet and Increase Protein Intake WOUND #1: - Lower Leg Wound Laterality: Right, Medial Cleanser: Normal Saline 2 x Per Week/30 Days Discharge Instructions: Wash your hands with soap and water. Remove old dressing, discard into plastic bag and place into trash. Cleanse the wound with Normal Saline prior to applying a clean dressing using gauze sponges, not tissues or  cotton balls. Do not scrub or use excessive force. Pat dry using gauze sponges, not tissue or cotton balls. Cleanser: Soap and Water 2 x Per Week/30 Days Discharge Instructions: Gently cleanse wound with antibacterial soap, rinse and pat dry prior to dressing wounds Julia Ashley-Wound Care: AandD Ointment 2 x Per Week/30 Days Discharge Instructions: Apply AandD Ointment as directed Primary Dressing: Prisma 4.34 (in) 2 x Per Week/30 Days Discharge Instructions: Moisten w/normal saline or sterile water; Cover wound as directed. Do not remove from wound bed. Secondary Dressing: ABD Pad 5x9 (in/in) 2 x Per Week/30 Days Discharge Instructions: Cover with ABD pad Compression Wrap: Medichoice 4 layer Compression System, 35-40 mmHG 2 x Per Week/30 Days Discharge Instructions: Apply multi-layer wrap as directed. 1. Would recommend that we going switch back over to a silver collagen dressing and the patient is in agreement with that plan. 2. I am also can recommend that we go ahead and continue with the ABD pad to cover followed by 4-layer compression wrap which I think is Julia Ashley, Julia Ashley ([id number]) doing great. 3. I would also suggest the patient continue to elevate her leg is much as possible to help with edema control. We will see patient back for reevaluation in 1 week here in the clinic. If anything worsens or changes patient will contact our office for additional recommendations. Electronic Signature(s) Signed: 07/17/2021 8:56:59 AM By: Lenda Kelp PA-C Entered By: Lenda Kelp on 07/17/2021 08:56:59 Julia Ashley ([id number]) -------------------------------------------------------------------------------- SuperBill Details Patient Name: Julia Ashley Date of Service: 07/17/2021 Medical Record Number: [id number] Patient Account Number: [id number] Date of Birth/Sex:  October 14, 1965 (55 y.o. F) Treating RN: Angelina Pih Primary Care Provider: Aurora Mask Other Clinician: Referring  Provider: Aurora Mask Treating Provider/Extender: Rowan Blase in Treatment: 5 Diagnosis Coding ICD-10 Codes Code Description [phone] Laceration without foreign body, right lower leg, initial encounter L97.812 Non-pressure chronic ulcer of other part of right lower leg with fat layer exposed I10 Essential (primary) hypertension Facility Procedures CPT4 Code: [id number] Description: (Facility Use Only) [phone] - APPLY MULTLAY COMPRS LWR RT LEG Modifier: Quantity: 1 Physician Procedures CPT4 Code: [id number] Description: 99213 - WC PHYS LEVEL 3 - EST PT Modifier: Quantity: 1 CPT4 Code: Description: ICD-10 Diagnosis Description S81.811A Laceration without foreign body, right lower leg, initial encounter L97.812 Non-pressure chronic ulcer of other part of right lower leg with fat la I10 Essential (primary) hypertension Modifier: yer exposed Quantity: Electronic Signature(s) Signed: 07/17/2021 8:57:14 AM By: Lenda Kelp PA-C Entered By: Lenda Kelp on 07/17/2021 08:57:13

## 2021-07-24 ENCOUNTER — Encounter: Payer: BC Managed Care – PPO | Admitting: Physician Assistant

## 2021-07-24 ENCOUNTER — Other Ambulatory Visit: Payer: Self-pay

## 2021-07-24 DIAGNOSIS — S81811A Laceration without foreign body, right lower leg, initial encounter: Secondary | ICD-10-CM | POA: Diagnosis not present

## 2021-07-24 DIAGNOSIS — W228XXA Striking against or struck by other objects, initial encounter: Secondary | ICD-10-CM | POA: Diagnosis not present

## 2021-07-24 DIAGNOSIS — L97812 Non-pressure chronic ulcer of other part of right lower leg with fat layer exposed: Secondary | ICD-10-CM | POA: Diagnosis not present

## 2021-07-24 DIAGNOSIS — I10 Essential (primary) hypertension: Secondary | ICD-10-CM | POA: Diagnosis not present

## 2021-07-24 NOTE — Progress Notes (Addendum)
Julia Ashley, Julia Ashley ([id number]) Visit Report for 07/24/2021 Chief Complaint Document Details Patient Name: Julia Ashley, Julia Ashley Date of Service: 07/24/2021 8:00 AM Medical Record Number: [id number] Patient Account Number: [id number] Date of Birth/Sex: 1966-01-15 (55 y.o. F) Treating RN: Yevonne Pax Primary Care Provider: Aurora Mask Other Clinician: Referring Provider: Aurora Mask Treating Provider/Extender: Rowan Blase in Treatment: 6 Information Obtained from: Patient Chief Complaint Laceration right leg Electronic Signature(s) Signed: 07/24/2021 8:26:22 AM By: Lenda Kelp PA-C Entered By: Lenda Kelp on 07/24/2021 55:03:54 Julia Ashley ([id number]) -------------------------------------------------------------------------------- HPI Details Patient Name: Julia Ashley Date of Service: 07/24/2021 8:00 AM Medical Record Number: [id number] Patient Account Number: [id number] Date of Birth/Sex: 05/02/1966 (55 y.o. F) Treating RN: Yevonne Pax Primary Care Provider: Aurora Mask Other Clinician: Referring Provider: Aurora Mask Treating Provider/Extender: Rowan Blase in Treatment: 6 History of Present Illness HPI Description: 06/11/2021 upon evaluation today patient presents for initial inspection here in the clinic concerning an issue that she had with a laceration on her right leg that occurred on May 30, 2021. This was when she was on a cruise they were actually in the [place] and they were on land off ship when this occurred she is not even sure what she hit on the scooter but nonetheless had a significant laceration. Subsequently her husband used a bungee cord to tourniquet the leg she tells me that it hurts severely. Nonetheless when she gets to the ship they actually did attempt to suture this back to the area that was sutured back appears to have survived the other half did not. Rating to remove the sutures today and then  subsequently cleaned away any of the dead necrotic tissue. Fortunately I do not see any signs of active infection systemically at this point. That is great news overall locally I think that infection is definitely something that we will get a need to consider here. Especially with the significant debridement like we are doing I will probably put her on a prophylactic antibiotic just to be on the safe side. I discussed that with the patient today as well. She has a history of hypertension but no other major medical problems. She does seem to bruise and bleed easily however. She has no history of any hematologic conditions. 06/19/2021 upon evaluation today patient's wound is actually showing signs of improvement. I am actually very pleased with where we stand and I think she is making progress. This again is a significant wound that occurred and I saw her for the first time last week overall it is good to take a bit of healing to fill in the center part of the wound where this is much deeper but nonetheless I believe that this is achievable. 06/26/2021 upon evaluation today patient appears to be making good progress. This is still a significant wound and is going to take a bit of time to get it where we want to have it. Nonetheless I think she is definitely making excellent improvements as we stand here currently. Fortunately I do not see any evidence of active infection systemically at this point which is great news. Locally is also doing great. She does have a little blister on the inside of her foot that seems to be rubbing I think this is due to the wrap and the fact that it is really tight getting into the crocs. 07/03/2021 upon evaluation today patient appears to be doing well with regard to her wound. This is measuring smaller which is good with that being said there  is some dry skin around the edge of [place] work on just a little bit. Also think that she seems to be doing well with regard to  the depth of the center part of the wound where she has a much deeper area here occurring. Nonetheless I think that we probably need to go ahead and see about packing this slightly less with regard to the [facility] so that it has room to grow again. Otherwise I think things are doing decently well. 07/10/2021 upon evaluation today patient's wound is actually showing signs of excellent improvement and very pleased with this. His actually making great progress and the wound bed itself is looking better not measuring his deep in the center part as well. 07/17/2021 upon evaluation today patient's wound is actually showing signs of excellent improvement. Fortunately there does not appear to be any evidence of active infection which is great news. No fevers, chills, nausea, vomiting, or diarrhea. 07/24/2021 upon evaluation today patient appears to be doing well with regards her wound. This is actually looking significantly smaller compared to where it was last week even. Overall I am extremely pleased with where we stand today. No fevers, chills, nausea, vomiting, or diarrhea. Electronic Signature(s) Signed: 07/24/2021 8:36:44 AM By: Lenda Kelp PA-C Entered By: Lenda Kelp on 07/24/2021 08:36:44 Julia Ashley ([id number]) -------------------------------------------------------------------------------- Physical Exam Details Patient Name: Julia Ashley Date of Service: 07/24/2021 8:00 AM Medical Record Number: [id number] Patient Account Number: [id number] Date of Birth/Sex: 1965-11-13 (55 y.o. F) Treating RN: Yevonne Pax Primary Care Provider: Aurora Mask Other Clinician: Referring Provider: Aurora Mask Treating Provider/Extender: Rowan Blase in Treatment: 6 Constitutional Well-nourished and well-hydrated in no acute distress. Respiratory normal breathing without difficulty. Psychiatric this patient is able to make decisions and demonstrates good insight  into disease process. Alert and Oriented x 3. pleasant and cooperative. Notes Patient's wound bed showed signs of good granulation epithelization at this point. Fortunately I do not see any signs of active infection and I think she is making excellent progress compared to where this has been it has healed quite nicely. Electronic Signature(s) Signed: 07/24/2021 8:37:00 AM By: Lenda Kelp PA-C Entered By: Lenda Kelp on 07/24/2021 08:36:59 Julia Ashley ([id number]) -------------------------------------------------------------------------------- Physician Orders Details Patient Name: Julia Ashley Date of Service: 07/24/2021 8:00 AM Medical Record Number: [id number] Patient Account Number: [id number] Date of Birth/Sex: Dec 14, 1965 (55 y.o. F) Treating RN: Yevonne Pax Primary Care Provider: Aurora Mask Other Clinician: Referring Provider: Aurora Mask Treating Provider/Extender: Rowan Blase in Treatment: 6 Verbal / Phone Orders: No Diagnosis Coding ICD-10 Coding Code Description [phone] Laceration without foreign body, right lower leg, initial encounter L97.812 Non-pressure chronic ulcer of other part of right lower leg with fat layer exposed I10 Essential (primary) hypertension Follow-up Appointments o Return Appointment in 1 week. Bathing/ Shower/ Hygiene o May shower with wound dressing protected with water repellent cover or cast protector. o No tub bath. Edema Control - Lymphedema / Segmental Compressive Device / Other o Optional: One layer of unna paste to top of compression wrap (to act as an anchor). o Elevate, Exercise Daily and Avoid Standing for Long Periods of Time. o Elevate legs to the level of the heart and pump ankles as often as possible o Elevate leg(s) parallel to the floor when sitting. Additional Orders / Instructions o Follow Nutritious Diet and Increase Protein Intake Wound Treatment Wound #1 - Lower Leg  Wound Laterality: Right, Medial Cleanser: Normal Saline 2 x Per Week/30 Days Discharge  Instructions: Wash your hands with soap and water. Remove old dressing, discard into plastic bag and place into trash. Cleanse the wound with Normal Saline prior to applying a clean dressing using gauze sponges, not tissues or cotton balls. Do not scrub or use excessive force. Pat dry using gauze sponges, not tissue or cotton balls. Cleanser: Soap and Water 2 x Per Week/30 Days Discharge Instructions: Gently cleanse wound with antibacterial soap, rinse and pat dry prior to dressing wounds Peri-Wound Care: AandD Ointment 2 x Per Week/30 Days Discharge Instructions: Apply AandD Ointment as directed Primary Dressing: Prisma 4.34 (in) 2 x Per Week/30 Days Discharge Instructions: Moisten w/normal saline or sterile water; Cover wound as directed. Do not remove from wound bed. Secondary Dressing: ABD Pad 5x9 (in/in) 2 x Per Week/30 Days Discharge Instructions: Cover with ABD pad Compression Wrap: Medichoice 4 layer Compression System, 35-40 mmHG 2 x Per Week/30 Days Discharge Instructions: Apply multi-layer wrap as directed. Electronic Signature(s) Signed: 07/24/2021 4:40:54 PM By: Lenda Kelp PA-C Signed: 07/25/2021 10:49:38 AM By: Yevonne Pax RN Entered By: Yevonne Pax on 07/24/2021 08:36:12 Julia Ashley ([id number]) -------------------------------------------------------------------------------- Problem List Details Patient Name: Julia Ashley Date of Service: 07/24/2021 8:00 AM Medical Record Number: [id number] Patient Account Number: [id number] Date of Birth/Sex: 02/23/1966 (55 y.o. F) Treating RN: Yevonne Pax Primary Care Provider: Aurora Mask Other Clinician: Referring Provider: Aurora Mask Treating Provider/Extender: Rowan Blase in Treatment: 6 Active Problems ICD-10 Encounter Code Description Active Date MDM Diagnosis S81.811A Laceration without foreign body,  right lower leg, initial encounter 06/11/2021 No Yes L97.812 Non-pressure chronic ulcer of other part of right lower leg with fat layer 06/11/2021 No Yes exposed I10 Essential (primary) hypertension 06/11/2021 No Yes Inactive Problems Resolved Problems Electronic Signature(s) Signed: 07/24/2021 8:26:18 AM By: Lenda Kelp PA-C Entered By: Lenda Kelp on 07/24/2021 08:26:18 Julia Ashley ([id number]) -------------------------------------------------------------------------------- Progress Note Details Patient Name: Julia Ashley Date of Service: 07/24/2021 8:00 AM Medical Record Number: [id number] Patient Account Number: [id number] Date of Birth/Sex: 10/18/1965 (55 y.o. F) Treating RN: Yevonne Pax Primary Care Provider: Aurora Mask Other Clinician: Referring Provider: Aurora Mask Treating Provider/Extender: Rowan Blase in Treatment: 6 Subjective Chief Complaint Information obtained from Patient Laceration right leg History of Present Illness (HPI) 06/11/2021 upon evaluation today patient presents for initial inspection here in the clinic concerning an issue that she had with a laceration on her right leg that occurred on May 30, 2021. This was when she was on a cruise they were actually in the [place] and they were on land off ship when this occurred she is not even sure what she hit on the scooter but nonetheless had a significant laceration. Subsequently her husband used a bungee cord to tourniquet the leg she tells me that it hurts severely. Nonetheless when she gets to the ship they actually did attempt to suture this back to the area that was sutured back appears to have survived the other half did not. Rating to remove the sutures today and then subsequently cleaned away any of the dead necrotic tissue. Fortunately I do not see any signs of active infection systemically at this point. That is great news overall locally I think that infection is  definitely something that we will get a need to consider here. Especially with the significant debridement like we are doing I will probably put her on a prophylactic antibiotic just to be on the safe side. I discussed that with the patient today as well. She has a history  of hypertension but no other major medical problems. She does seem to bruise and bleed easily however. She has no history of any hematologic conditions. 06/19/2021 upon evaluation today patient's wound is actually showing signs of improvement. I am actually very pleased with where we stand and I think she is making progress. This again is a significant wound that occurred and I saw her for the first time last week overall it is good to take a bit of healing to fill in the center part of the wound where this is much deeper but nonetheless I believe that this is achievable. 06/26/2021 upon evaluation today patient appears to be making good progress. This is still a significant wound and is going to take a bit of time to get it where we want to have it. Nonetheless I think she is definitely making excellent improvements as we stand here currently. Fortunately I do not see any evidence of active infection systemically at this point which is great news. Locally is also doing great. She does have a little blister on the inside of her foot that seems to be rubbing I think this is due to the wrap and the fact that it is really tight getting into the crocs. 07/03/2021 upon evaluation today patient appears to be doing well with regard to her wound. This is measuring smaller which is good with that being said there is some dry skin around the edge of [place] work on just a little bit. Also think that she seems to be doing well with regard to the depth of the center part of the wound where she has a much deeper area here occurring. Nonetheless I think that we probably need to go ahead and see about packing this slightly less with regard to the  [facility] so that it has room to grow again. Otherwise I think things are doing decently well. 07/10/2021 upon evaluation today patient's wound is actually showing signs of excellent improvement and very pleased with this. His actually making great progress and the wound bed itself is looking better not measuring his deep in the center part as well. 07/17/2021 upon evaluation today patient's wound is actually showing signs of excellent improvement. Fortunately there does not appear to be any evidence of active infection which is great news. No fevers, chills, nausea, vomiting, or diarrhea. 07/24/2021 upon evaluation today patient appears to be doing well with regards her wound. This is actually looking significantly smaller compared to where it was last week even. Overall I am extremely pleased with where we stand today. No fevers, chills, nausea, vomiting, or diarrhea. Objective Constitutional Well-nourished and well-hydrated in no acute distress. Vitals Time Taken: 8:16 AM, Height: 72 in, Weight: 277 lbs, BMI: 37.6, Temperature: 98.5 F, Pulse: 92 bpm, Respiratory Rate: 18 breaths/min, Blood Pressure: 166/105 mmHg. Respiratory normal breathing without difficulty. Psychiatric this patient is able to make decisions and demonstrates good insight into disease process. Alert and Oriented x 3. pleasant and cooperative. Julia Ashley, Julia Ashley ([id number]) General Notes: Patient's wound bed showed signs of good granulation epithelization at this point. Fortunately I do not see any signs of active infection and I think she is making excellent progress compared to where this has been it has healed quite nicely. Integumentary (Hair, Skin) Wound #1 status is Open. Original cause of wound was Trauma. The date acquired was: 05/30/2021. The wound has been in treatment 6 weeks. The wound is located on the Right,Medial Lower Leg. The wound measures 3.5cm length x 1.7cm width  x 0.2cm depth; 4.673cm^2 area  and 0.935cm^3 volume. There is Fat Layer (Subcutaneous Tissue) exposed. There is no tunneling or undermining noted. There is a medium amount of serosanguineous drainage noted. There is large (67-100%) red, pink granulation within the wound bed. There is a small (1-33%) amount of necrotic tissue within the wound bed including Adherent Slough. Assessment Active Problems ICD-10 Laceration without foreign body, right lower leg, initial encounter Non-pressure chronic ulcer of other part of right lower leg with fat layer exposed Essential (primary) hypertension Procedures Wound #1 Pre-procedure diagnosis of Wound #1 is a Trauma, Other located on the Right,Medial Lower Leg . There was a Four Layer Compression Therapy Procedure by Yevonne Pax, RN. Post procedure Diagnosis Wound #1: Same as Pre-Procedure Plan Follow-up Appointments: Return Appointment in 1 week. Bathing/ Shower/ Hygiene: May shower with wound dressing protected with water repellent cover or cast protector. No tub bath. Edema Control - Lymphedema / Segmental Compressive Device / Other: Optional: One layer of unna paste to top of compression wrap (to act as an anchor). Elevate, Exercise Daily and Avoid Standing for Long Periods of Time. Elevate legs to the level of the heart and pump ankles as often as possible Elevate leg(s) parallel to the floor when sitting. Additional Orders / Instructions: Follow Nutritious Diet and Increase Protein Intake WOUND #1: - Lower Leg Wound Laterality: Right, Medial Cleanser: Normal Saline 2 x Per Week/30 Days Discharge Instructions: Wash your hands with soap and water. Remove old dressing, discard into plastic bag and place into trash. Cleanse the wound with Normal Saline prior to applying a clean dressing using gauze sponges, not tissues or cotton balls. Do not scrub or use excessive force. Pat dry using gauze sponges, not tissue or cotton balls. Cleanser: Soap and Water 2 x Per Week/30  Days Discharge Instructions: Gently cleanse wound with antibacterial soap, rinse and pat dry prior to dressing wounds Peri-Wound Care: AandD Ointment 2 x Per Week/30 Days Discharge Instructions: Apply AandD Ointment as directed Primary Dressing: Prisma 4.34 (in) 2 x Per Week/30 Days Discharge Instructions: Moisten w/normal saline or sterile water; Cover wound as directed. Do not remove from wound bed. Secondary Dressing: ABD Pad 5x9 (in/in) 2 x Per Week/30 Days Discharge Instructions: Cover with ABD pad Compression Wrap: Medichoice 4 layer Compression System, 35-40 mmHG 2 x Per Week/30 Days Discharge Instructions: Apply multi-layer wrap as directed. [place], Bianey ([id number]) 1. Would recommend currently that we going to continue with the silver collagen which I think is doing a great job compared to last week this is significantly better this week. 2. I am also can recommend we continue to use AandD ointment around the edges of the wound. I am also can recommend continuation of the ABD pad as well as the 4-layer compression wrap. We will see patient back for reevaluation in 1 week here in the clinic. If anything worsens or changes patient will contact our office for additional recommendations. Electronic Signature(s) Signed: 07/24/2021 8:37:52 AM By: Lenda Kelp PA-C Entered By: Lenda Kelp on 07/24/2021 08:37:52 Julia Ashley, Julia Ashley ([id number]) -------------------------------------------------------------------------------- SuperBill Details Patient Name: Julia Ashley Date of Service: 07/24/2021 Medical Record Number: [id number] Patient Account Number: [id number] Date of Birth/Sex: 1966/03/23 (55 y.o. F) Treating RN: Yevonne Pax Primary Care Provider: Aurora Mask Other Clinician: Referring Provider: Aurora Mask Treating Provider/Extender: Rowan Blase in Treatment: 6 Diagnosis Coding ICD-10 Codes Code Description [phone] Laceration without foreign  body, right lower leg, initial encounter L97.812 Non-pressure chronic ulcer of other part of  right lower leg with fat layer exposed I10 Essential (primary) hypertension Facility Procedures CPT4 Code: [id number] Description: (Facility Use Only) [phone] - APPLY MULTLAY COMPRS LWR RT LEG Modifier: Quantity: 1 Physician Procedures CPT4 Code: 1696789 Description: 99213 - WC PHYS LEVEL 3 - EST PT Modifier: Quantity: 1 CPT4 Code: Description: ICD-10 Diagnosis Description S81.811A Laceration without foreign body, right lower leg, initial encounter L97.812 Non-pressure chronic ulcer of other part of right lower leg with fat la I10 Essential (primary) hypertension Modifier: yer exposed Quantity: Electronic Signature(s) Signed: 07/24/2021 4:40:54 PM By: Lenda Kelp PA-C Signed: 07/25/2021 10:49:38 AM By: Yevonne Pax RN Previous Signature: 07/24/2021 8:38:06 AM Version By: Lenda Kelp PA-C Entered By: Yevonne Pax on 07/24/2021 08:45:07

## 2021-07-25 NOTE — Progress Notes (Signed)
KEON, BENSCOTER ([id number]) Visit Report for 07/24/2021 Arrival Information Details Patient Name: Julia Ashley, Julia Ashley Date of Service: 07/24/2021 8:00 AM Medical Record Number: [id number] Patient Account Number: [id number] Date of Birth/Sex: Mar 15, 1966 (55 y.o. F) Treating RN: Carlene Coria Primary Care Elia Nunley: Gemma Payor Other Clinician: Referring Tadeusz Stahl: Gemma Payor Treating Jaidence Geisler/Extender: Skipper Cliche in Treatment: 6 Visit Information History Since Last Visit All ordered tests and consults were completed: No Patient Arrived: Ambulatory Added or deleted any medications: No Arrival Time: 08:15 Any new allergies or adverse reactions: No Accompanied By: self Had a fall or experienced change in No Transfer Assistance: None activities of daily living that may affect Patient Identification Verified: Yes risk of falls: Secondary Verification Process Completed: Yes Signs or symptoms of abuse/neglect since last visito No Patient Requires Transmission-Based Precautions: No Hospitalized since last visit: No Patient Has Alerts: No Implantable device outside of the clinic excluding No cellular tissue based products placed in the center since last visit: Has Dressing in Place as Prescribed: Yes Has Compression in Place as Prescribed: Yes Pain Present Now: No Electronic Signature(s) Signed: 07/25/2021 10:49:38 AM By: Carlene Coria RN Entered By: Carlene Coria on 07/24/2021 08:16:22 Julia Ashley ([id number]) -------------------------------------------------------------------------------- Clinic Level of Care Assessment Details Patient Name: Julia Ashley Date of Service: 07/24/2021 8:00 AM Medical Record Number: [id number] Patient Account Number: [id number] Date of Birth/Sex: [birth year] (55 y.o. F) Treating RN: Carlene Coria Primary Care Lazar Tierce: Gemma Payor Other Clinician: Referring Fifi Schindler: Gemma Payor Treating Makayia Duplessis/Extender:  Skipper Cliche in Treatment: 6 Clinic Level of Care Assessment Items TOOL 1 Quantity Score _0  - Use when EandM and Procedure is performed on INITIAL visit 0 ASSESSMENTS - Nursing Assessment / Reassessment _1  - General Physical Exam (combine w/ comprehensive assessment (listed just below) when performed on new 0 pt. evals) _2  - 0 Comprehensive Assessment (HX, ROS, Risk Assessments, Wounds Hx, etc.) ASSESSMENTS - Wound and Skin Assessment / Reassessment _3  - Dermatologic / Skin Assessment (not related to wound area) 0 ASSESSMENTS - Ostomy and/or Continence Assessment and Care _4  - Incontinence Assessment and Management 0 _5  - 0 Ostomy Care Assessment and Management (repouching, etc.) PROCESS - Coordination of Care _6  - Simple Patient / Family Education for ongoing care 0 _7  - 0 Complex (extensive) Patient / Family Education for ongoing care _8  - 0 Staff obtains Programmer, systems, Records, Test Results / Process Orders _9  - 0 Staff telephones HHA, Nursing Homes / Clarify orders / etc _10  - 0 Routine Transfer to another Facility (non-emergent condition) _11  - 0 Routine Hospital Admission (non-emergent condition) _12  - 0 New Admissions / Biomedical engineer / Ordering NPWT, Apligraf, etc. _13  - 0 Emergency Hospital Admission (emergent condition) PROCESS - Special Needs _14  - Pediatric / Minor Patient Management 0 _15  - 0 Isolation Patient Management _16  - 0 Hearing / Language / Visual special needs _17  - 0 Assessment of Community assistance (transportation, D/C planning, etc.) _18  - 0 Additional assistance / Altered mentation _19  - 0 Support Surface(s) Assessment (bed, cushion, seat, etc.) INTERVENTIONS - Miscellaneous _20  - External ear exam 0 _21  - 0 Patient Transfer (multiple staff / Civil Service fast streamer / Similar devices) _22  - 0 Simple Staple / Suture removal (25 or less) _23  - 0 Complex Staple / Suture removal (26 or more) _24  - 0 Hypo/Hyperglycemic Management (do not check if billed  separately) _25  - 0 Ankle / Brachial Index (ABI) - do not check if billed separately Has the patient been seen at the hospital within the last three years: Yes Total  Score: 0 Level Of Care: ____ Julia Ashley ([id number]) Electronic Signature(s) Signed: 07/25/2021 10:49:38 AM By: Carlene Coria RN Entered By: Carlene Coria on 07/24/2021 08:44:56 Julia Ashley ([id number]) -------------------------------------------------------------------------------- Compression Therapy Details Patient Name: Julia Ashley Date of Service: 07/24/2021 8:00 AM Medical Record Number: [id number] Patient Account Number: [id number] Date of Birth/Sex: Nov 07, 1965 (55 y.o. F) Treating RN: Carlene Coria Primary Care Jahziel Sinn: Gemma Payor Other Clinician: Referring Brissia Delisa: Gemma Payor Treating Dekendrick Uzelac/Extender: Skipper Cliche in Treatment: 6 Compression Therapy Performed for Wound Assessment: Wound #1 Right,Medial Lower Leg Performed By: Clinician Carlene Coria, RN Compression Type: Four Layer Post Procedure Diagnosis Same as Pre-procedure Electronic Signature(s) Signed: 07/25/2021 10:49:38 AM By: Carlene Coria RN Entered By: Carlene Coria on 07/24/2021 08:32:55 Julia Ashley ([id number]) -------------------------------------------------------------------------------- Encounter Discharge Information Details Patient Name: Julia Ashley Date of Service: 07/24/2021 8:00 AM Medical Record Number: [id number] Patient Account Number: [id number] Date of Birth/Sex: Jan 25, 1966 (55 y.o. F) Treating RN: Carlene Coria Primary Care Levester Waldridge: Gemma Payor Other Clinician: Referring Terryl Niziolek: Gemma Payor Treating Jaila Schellhorn/Extender: Skipper Cliche in Treatment: 6 Encounter Discharge Information Items Discharge Condition: Stable Ambulatory Status: Ambulatory Discharge Destination: Home Transportation: Private Auto Accompanied By: self Schedule Follow-up Appointment:  Yes Clinical Summary of Care: Patient Declined Electronic Signature(s) Signed: 07/25/2021 10:49:38 AM By: Carlene Coria RN Entered By: Carlene Coria on 07/24/2021 08:44:38 Julia Ashley ([id number]) -------------------------------------------------------------------------------- Lower Extremity Assessment Details Patient Name: Julia Ashley Date of Service: 07/24/2021 8:00 AM Medical Record Number: [id number] Patient Account Number: [id number] Date of Birth/Sex: [birth year] (55 y.o. F) Treating RN: Carlene Coria Primary Care Di Jasmer: Gemma Payor Other Clinician: Referring Joah Patlan: Gemma Payor Treating Chaquana Nichols/Extender: Skipper Cliche in Treatment: 6 Edema Assessment Assessed: [Left: No] [Right: No] Edema: [Left: Ye] [Right: s] Calf Left: Right: Point of Measurement: 34 cm From Medial Instep 44 cm Ankle Left: Right: Point of Measurement: 11 cm From Medial Instep 27 cm Vascular Assessment Pulses: Dorsalis Pedis Palpable: [Right:Yes] Electronic Signature(s) Signed: 07/25/2021 10:49:38 AM By: Carlene Coria RN Entered By: Carlene Coria on 07/24/2021 08:25:17 Julia Ashley ([id number]) -------------------------------------------------------------------------------- Multi Wound Chart Details Patient Name: Julia Ashley Date of Service: 07/24/2021 8:00 AM Medical Record Number: [id number] Patient Account Number: [id number] Date of Birth/Sex: 14-Aug-1965 (55 y.o. F) Treating RN: Carlene Coria Primary Care Tyreshia Ingman: Gemma Payor Other Clinician: Referring Konnar Ben: Gemma Payor Treating Keats Kingry/Extender: Skipper Cliche in Treatment: 6 Vital Signs Height(in): 72 Pulse(bpm): 92 Weight(lbs): 072 Blood Pressure(mmHg): 166/105 Body Mass Index(BMI): 38 Temperature(F): 98.5 Respiratory Rate(breaths/min): 18 Photos: [N/A:N/A] Wound Location: Right, Medial Lower Leg N/A N/A Wounding Event: Trauma N/A N/A Primary Etiology: Trauma, Other  N/A N/A Comorbid History: Hypertension N/A N/A Date Acquired: 05/30/2021 N/A N/A Weeks of Treatment: 6 N/A N/A Wound Status: Open N/A N/A Measurements L x W x D (cm) 3.5x1.7x0.2 N/A N/A Area (cm) : 4.673 N/A N/A Volume (cm) : 0.935 N/A N/A % Reduction in Area: 89.80% N/A N/A % Reduction in Volume: 93.20% N/A N/A Classification: Full Thickness Without Exposed N/A N/A Support Structures Exudate Amount: Medium N/A N/A Exudate Type: Serosanguineous N/A N/A Exudate Color: red, brown N/A N/A Granulation Amount: Large (67-100%) N/A N/A Granulation Quality: Red, Pink N/A N/A Necrotic Amount: Small (1-33%) N/A N/A Exposed Structures: Fat Layer (Subcutaneous Tissue): N/A N/A Yes Fascia: No Tendon: No Muscle: No Joint: No Bone: No Epithelialization: None N/A N/A Treatment Notes Electronic Signature(s) Signed: 07/25/2021 10:49:38 AM By: Carlene Coria RN Entered By: Carlene Coria on 07/24/2021 08:32:32 Julia Ashley ([id number]) -------------------------------------------------------------------------------- [place] Details Patient Name: Julia Ashley Date of  Service: 07/24/2021 8:00 AM Medical Record Number: [id number] Patient Account Number: [id number] Date of Birth/Sex: [birth year] (55 y.o. F) Treating RN: Carlene Coria Primary Care Kieon Lawhorn: Gemma Payor Other Clinician: Referring Raelyn Racette: Gemma Payor Treating Gisela Lea/Extender: Skipper Cliche in Treatment: 6 Active Inactive Wound/Skin Impairment Nursing Diagnoses: Knowledge deficit related to ulceration/compromised skin integrity Goals: Patient/caregiver will verbalize understanding of skin care regimen Date Initiated: 06/11/2021 Date Inactivated: 06/19/2021 Target Resolution Date: 07/11/2021 Goal Status: Met Ulcer/skin breakdown will have a volume reduction of 30% by week 4 Date Initiated: 06/11/2021 Target Resolution Date: 08/11/2021 Goal Status: Active Ulcer/skin breakdown will  have a volume reduction of 50% by week 8 Date Initiated: 06/11/2021 Target Resolution Date: 09/11/2021 Goal Status: Active Ulcer/skin breakdown will have a volume reduction of 80% by week 12 Date Initiated: 06/11/2021 Target Resolution Date: 10/09/2021 Goal Status: Active Ulcer/skin breakdown will heal within 14 weeks Date Initiated: 06/11/2021 Target Resolution Date: 11/09/2021 Goal Status: Active Interventions: Assess patient/caregiver ability to obtain necessary supplies Assess patient/caregiver ability to perform ulcer/skin care regimen upon admission and as needed Assess ulceration(s) every visit Notes: Electronic Signature(s) Signed: 07/25/2021 10:49:38 AM By: Carlene Coria RN Entered By: Carlene Coria on 07/24/2021 08:32:20 Julia Ashley ([id number]) -------------------------------------------------------------------------------- Pain Assessment Details Patient Name: Julia Ashley Date of Service: 07/24/2021 8:00 AM Medical Record Number: [id number] Patient Account Number: [id number] Date of Birth/Sex: 1965/10/31 (55 y.o. F) Treating RN: Carlene Coria Primary Care Lupe Bonner: Gemma Payor Other Clinician: Referring Avary Pitsenbarger: Gemma Payor Treating Styles Fambro/Extender: Skipper Cliche in Treatment: 6 Active Problems Location of Pain Severity and Description of Pain Patient Has Paino No Site Locations Pain Management and Medication Current Pain Management: Electronic Signature(s) Signed: 07/25/2021 10:49:38 AM By: Carlene Coria RN Entered By: Carlene Coria on 07/24/2021 08:17:28 Julia Ashley ([id number]) -------------------------------------------------------------------------------- Patient/Caregiver Education Details Patient Name: Julia Ashley Date of Service: 07/24/2021 8:00 AM Medical Record Number: [id number] Patient Account Number: [id number] Date of Birth/Gender: March 29, 1966 (55 y.o. F) Treating RN: Carlene Coria Primary Care Physician: Gemma Payor Other Clinician: Referring Physician: Gemma Payor Treating Physician/Extender: Skipper Cliche in Treatment: 6 Education Assessment Education Provided To: Patient Education Topics Provided Wound/Skin Impairment: Methods: Explain/Verbal Responses: State content correctly Electronic Signature(s) Signed: 07/25/2021 10:49:38 AM By: Carlene Coria RN Entered By: Carlene Coria on 07/24/2021 08:43:51 Julia Ashley ([id number]) -------------------------------------------------------------------------------- Wound Assessment Details Patient Name: Julia Ashley Date of Service: 07/24/2021 8:00 AM Medical Record Number: [id number] Patient Account Number: [id number] Date of Birth/Sex: [birth year] (55 y.o. F) Treating RN: Carlene Coria Primary Care Terrelle Ruffolo: Gemma Payor Other Clinician: Referring Chrystle Murillo: Gemma Payor Treating Berna Gitto/Extender: Skipper Cliche in Treatment: 6 Wound Status Wound Number: 1 Primary Etiology: Trauma, Other Wound Location: Right, Medial Lower Leg Wound Status: Open Wounding Event: Trauma Comorbid History: Hypertension Date Acquired: 05/30/2021 Weeks Of Treatment: 6 Clustered Wound: No Photos Wound Measurements Length: (cm) 3.5 Width: (cm) 1.7 Depth: (cm) 0.2 Area: (cm) 4.673 Volume: (cm) 0.935 % Reduction in Area: 89.8% % Reduction in Volume: 93.2% Epithelialization: None Tunneling: No Undermining: No Wound Description Classification: Full Thickness Without Exposed Support Structures Exudate Amount: Medium Exudate Type: Serosanguineous Exudate Color: red, brown Foul Odor After Cleansing: No Slough/Fibrino Yes Wound Bed Granulation Amount: Large (67-100%) Exposed Structure Granulation Quality: Red, Pink Fascia Exposed: No Necrotic Amount: Small (1-33%) Fat Layer (Subcutaneous Tissue) Exposed: Yes Necrotic Quality: Adherent Slough Tendon Exposed: No Muscle Exposed: No Joint Exposed: No Bone Exposed:  No Treatment Notes Wound #1 (Lower Leg) Wound Laterality: Right, Medial Cleanser Normal Saline Discharge Instruction: Wash your hands with soap  and water. Remove old dressing, discard into plastic bag and place into trash. Cleanse the wound with Normal Saline prior to applying a clean dressing using gauze sponges, not tissues or cotton balls. Do not scrub or use excessive force. Pat dry using gauze sponges, not tissue or cotton balls. Soap and [place], [place] ([id number]) Discharge Instruction: Gently cleanse wound with antibacterial soap, rinse and pat dry prior to dressing wounds [place] Discharge Instruction: Apply AandD Ointment as directed Topical Primary Dressing Prisma 4.34 (in) Discharge Instruction: Moisten w/normal saline or sterile water; Cover wound as directed. Do not remove from wound bed. Secondary Dressing ABD Pad 5x9 (in/in) Discharge Instruction: Cover with ABD pad Secured With Compression Wrap Medichoice 4 layer Compression System, 35-40 mmHG Discharge Instruction: Apply multi-layer wrap as directed. Compression Stockings Add-Ons Electronic Signature(s) Signed: 07/25/2021 10:49:38 AM By: Carlene Coria RN Entered By: Carlene Coria on 07/24/2021 08:24:25 Julia Ashley ([id number]) -------------------------------------------------------------------------------- Vitals Details Patient Name: Julia Ashley Date of Service: 07/24/2021 8:00 AM Medical Record Number: [id number] Patient Account Number: [id number] Date of Birth/Sex: [birth year] (55 y.o. F) Treating RN: Carlene Coria Primary Care Avishai Reihl: Gemma Payor Other Clinician: Referring Kolbee Stallman: Gemma Payor Treating Anaiza Behrens/Extender: Skipper Cliche in Treatment: 6 Vital Signs Time Taken: 08:16 Temperature (F): 98.5 Height (in): 72 Pulse (bpm): 92 Weight (lbs): 277 Respiratory Rate (breaths/min): 18 Body Mass Index (BMI): 37.6 Blood Pressure (mmHg):  166/105 Reference Range: 80 - 120 mg / dl Electronic Signature(s) Signed: 07/25/2021 10:49:38 AM By: Carlene Coria RN Entered By: Carlene Coria on 07/24/2021 08:16:50

## 2021-07-31 ENCOUNTER — Encounter: Payer: BC Managed Care – PPO | Admitting: Internal Medicine

## 2021-07-31 ENCOUNTER — Other Ambulatory Visit: Payer: Self-pay

## 2021-07-31 DIAGNOSIS — I10 Essential (primary) hypertension: Secondary | ICD-10-CM | POA: Diagnosis not present

## 2021-07-31 DIAGNOSIS — W228XXA Striking against or struck by other objects, initial encounter: Secondary | ICD-10-CM | POA: Diagnosis not present

## 2021-07-31 DIAGNOSIS — L97812 Non-pressure chronic ulcer of other part of right lower leg with fat layer exposed: Secondary | ICD-10-CM | POA: Diagnosis not present

## 2021-07-31 DIAGNOSIS — S81811A Laceration without foreign body, right lower leg, initial encounter: Secondary | ICD-10-CM | POA: Diagnosis not present

## 2021-08-03 NOTE — Progress Notes (Signed)
Julia Ashley, Julia Ashley ([id number]) Visit Report for 07/31/2021 Arrival Information Details Patient Name: Julia Ashley, Julia Ashley Date of Service: 07/31/2021 12:30 PM Medical Record Number: [id number] Patient Account Number: [id number] Date of Birth/Sex: 1966/06/30 (55 y.o. F) Treating RN: Carlene Coria Primary Care Shamiracle Gorden: Gemma Payor Other Clinician: Referring Forestine Macho: Gemma Payor Treating Dim Meisinger/Extender: Tito Dine in Treatment: 7 Visit Information History Since Last Visit All ordered tests and consults were completed: No Patient Arrived: Ambulatory Added or deleted any medications: No Arrival Time: 12:37 Any new allergies or adverse reactions: No Accompanied By: self Had a fall or experienced change in No Transfer Assistance: None activities of daily living that may affect Patient Identification Verified: Yes risk of falls: Secondary Verification Process Completed: Yes Signs or symptoms of abuse/neglect since last visito No Patient Requires Transmission-Based Precautions: No Hospitalized since last visit: No Patient Has Alerts: No Implantable device outside of the clinic excluding No cellular tissue based products placed in the center since last visit: Has Dressing in Place as Prescribed: Yes Has Compression in Place as Prescribed: Yes Pain Present Now: No Electronic Signature(s) Signed: 08/03/2021 4:17:41 PM By: Carlene Coria RN Entered By: Carlene Coria on 07/31/2021 12:41:33 Julia Ashley ([id number]) -------------------------------------------------------------------------------- Clinic Level of Care Assessment Details Patient Name: Julia Ashley Date of Service: 07/31/2021 12:30 PM Medical Record Number: [id number] Patient Account Number: [id number] Date of Birth/Sex: 11/21/1965 (55 y.o. F) Treating RN: Carlene Coria Primary Care Enslee Bibbins: Gemma Payor Other Clinician: Referring Carmin Alvidrez: Gemma Payor Treating  Sequoia Mincey/Extender: Tito Dine in Treatment: 7 Clinic Level of Care Assessment Items TOOL 1 Quantity Score '[]'  - Use when EandM and Procedure is performed on INITIAL visit 0 ASSESSMENTS - Nursing Assessment / Reassessment '[]'  - General Physical Exam (combine w/ comprehensive assessment (listed just below) when performed on new 0 pt. evals) '[]'  - 0 Comprehensive Assessment (HX, ROS, Risk Assessments, Wounds Hx, etc.) ASSESSMENTS - Wound and Skin Assessment / Reassessment '[]'  - Dermatologic / Skin Assessment (not related to wound area) 0 ASSESSMENTS - Ostomy and/or Continence Assessment and Care '[]'  - Incontinence Assessment and Management 0 '[]'  - 0 Ostomy Care Assessment and Management (repouching, etc.) PROCESS - Coordination of Care '[]'  - Simple Patient / Family Education for ongoing care 0 '[]'  - 0 Complex (extensive) Patient / Family Education for ongoing care '[]'  - 0 Staff obtains Programmer, systems, Records, Test Results / Process Orders '[]'  - 0 Staff telephones HHA, Nursing Homes / Clarify orders / etc '[]'  - 0 Routine Transfer to another Facility (non-emergent condition) '[]'  - 0 Routine Hospital Admission (non-emergent condition) '[]'  - 0 New Admissions / Biomedical engineer / Ordering NPWT, Apligraf, etc. '[]'  - 0 Emergency Hospital Admission (emergent condition) PROCESS - Special Needs '[]'  - Pediatric / Minor Patient Management 0 '[]'  - 0 Isolation Patient Management '[]'  - 0 Hearing / Language / Visual special needs '[]'  - 0 Assessment of Community assistance (transportation, D/C planning, etc.) '[]'  - 0 Additional assistance / Altered mentation '[]'  - 0 Support Surface(s) Assessment (bed, cushion, seat, etc.) INTERVENTIONS - Miscellaneous '[]'  - External ear exam 0 '[]'  - 0 Patient Transfer (multiple staff / Civil Service fast streamer / Similar devices) '[]'  - 0 Simple Staple / Suture removal (25 or less) '[]'  - 0 Complex Staple / Suture removal (26 or more) '[]'  - 0 Hypo/Hyperglycemic Management  (do not check if billed separately) '[]'  - 0 Ankle / Brachial Index (ABI) - do not check if billed separately Has the patient been seen at the hospital within the last three years:  Yes Total Score: 0 Level Of Care: ____ Julia Ashley ([id number]) Electronic Signature(s) Signed: 08/03/2021 4:17:41 PM By: Carlene Coria RN Entered By: Carlene Coria on 07/31/2021 13:10:36 Julia Ashley ([id number]) -------------------------------------------------------------------------------- Compression Therapy Details Patient Name: Julia Ashley Date of Service: 07/31/2021 12:30 PM Medical Record Number: [id number] Patient Account Number: [id number] Date of Birth/Sex: Dec 03, 1965 (55 y.o. F) Treating RN: Carlene Coria Primary Care Carin Shipp: Gemma Payor Other Clinician: Referring Jhostin Epps: Gemma Payor Treating Carel Schnee/Extender: Tito Dine in Treatment: 7 Compression Therapy Performed for Wound Assessment: Wound #1 Right,Medial Lower Leg Performed By: Clinician Carlene Coria, RN Compression Type: Four Layer Post Procedure Diagnosis Same as Pre-procedure Electronic Signature(s) Signed: 08/03/2021 4:17:41 PM By: Carlene Coria RN Entered By: Carlene Coria on 07/31/2021 12:56:53 Julia Ashley ([id number]) -------------------------------------------------------------------------------- Encounter Discharge Information Details Patient Name: Julia Ashley Date of Service: 07/31/2021 12:30 PM Medical Record Number: [id number] Patient Account Number: [id number] Date of Birth/Sex: July 25, 1966 (55 y.o. F) Treating RN: Carlene Coria Primary Care Debrah Granderson: Gemma Payor Other Clinician: Referring Takiyah Bohnsack: Gemma Payor Treating Patrica Mendell/Extender: Tito Dine in Treatment: 7 Encounter Discharge Information Items Discharge Condition: Stable Ambulatory Status: Ambulatory Discharge Destination: Home Transportation: Private Auto Accompanied By:  self Schedule Follow-up Appointment: Yes Clinical Summary of Care: Patient Declined Electronic Signature(s) Signed: 08/03/2021 4:17:41 PM By: Carlene Coria RN Entered By: Carlene Coria on 07/31/2021 13:11:29 Julia Ashley ([id number]) -------------------------------------------------------------------------------- Lower Extremity Assessment Details Patient Name: Julia Ashley Date of Service: 07/31/2021 12:30 PM Medical Record Number: [id number] Patient Account Number: [id number] Date of Birth/Sex: [birth year] (55 y.o. F) Treating RN: Carlene Coria Primary Care Aftin Lye: Gemma Payor Other Clinician: Referring Winefred Hillesheim: Gemma Payor Treating Jacobi Ryant/Extender: Tito Dine in Treatment: 7 Edema Assessment Assessed: [Left: No] [Right: No] Edema: [Left: Ye] [Right: s] Calf Left: Right: Point of Measurement: 34 cm From Medial Instep 44 cm Ankle Left: Right: Point of Measurement: 11 cm From Medial Instep 27 cm Vascular Assessment Pulses: Dorsalis Pedis Palpable: [Right:Yes] Electronic Signature(s) Signed: 08/03/2021 4:17:41 PM By: Carlene Coria RN Entered By: Carlene Coria on 07/31/2021 12:53:59 Julia Ashley ([id number]) -------------------------------------------------------------------------------- Multi Wound Chart Details Patient Name: Julia Ashley Date of Service: 07/31/2021 12:30 PM Medical Record Number: [id number] Patient Account Number: [id number] Date of Birth/Sex: [birth year] (55 y.o. F) Treating RN: Carlene Coria Primary Care Anisa Leanos: Gemma Payor Other Clinician: Referring Meryl Ponder: Gemma Payor Treating Myonna Chisom/Extender: Tito Dine in Treatment: 7 Vital Signs Height(in): 72 Pulse(bpm): 94 Weight(lbs): 568 Blood Pressure(mmHg): 175/107 Body Mass Index(BMI): 38 Temperature(F): 98.3 Respiratory Rate(breaths/min): 18 Photos: [N/A:N/A] Wound Location: Right, Medial Lower Leg N/A N/A Wounding Event:  Trauma N/A N/A Primary Etiology: Trauma, Other N/A N/A Comorbid History: Hypertension N/A N/A Date Acquired: 05/30/2021 N/A N/A Weeks of Treatment: 7 N/A N/A Wound Status: Open N/A N/A Measurements L x W x D (cm) 3.5x1.3x0.2 N/A N/A Area (cm) : 3.574 N/A N/A Volume (cm) : 0.715 N/A N/A % Reduction in Area: 92.20% N/A N/A % Reduction in Volume: 94.80% N/A N/A Classification: Full Thickness Without Exposed N/A N/A Support Structures Exudate Amount: Medium N/A N/A Exudate Type: Serosanguineous N/A N/A Exudate Color: red, brown N/A N/A Granulation Amount: Large (67-100%) N/A N/A Granulation Quality: Red, Pink N/A N/A Necrotic Amount: None Present (0%) N/A N/A Exposed Structures: Fat Layer (Subcutaneous Tissue): N/A N/A Yes Fascia: No Tendon: No Muscle: No Joint: No Bone: No Epithelialization: Small (1-33%) N/A N/A Treatment Notes Electronic Signature(s) Signed: 08/03/2021 4:17:41 PM By: Carlene Coria RN Entered By: Carlene Coria on 07/31/2021 12:55:36 Julia Ashley ([id number]) -------------------------------------------------------------------------------- Multi-Disciplinary Care  Plan Details Patient Name: Julia Ashley, Julia Ashley Date of Service: 07/31/2021 12:30 PM Medical Record Number: [id number] Patient Account Number: [id number] Date of Birth/Sex: [birth year] (55 y.o. F) Treating RN: Carlene Coria Primary Care Jasreet Dickie: Gemma Payor Other Clinician: Referring Chance Munter: Gemma Payor Treating Aliany Fiorenza/Extender: Tito Dine in Treatment: 7 Active Inactive Wound/Skin Impairment Nursing Diagnoses: Knowledge deficit related to ulceration/compromised skin integrity Goals: Patient/caregiver will verbalize understanding of skin care regimen Date Initiated: 06/11/2021 Date Inactivated: 06/19/2021 Target Resolution Date: 07/11/2021 Goal Status: Met Ulcer/skin breakdown will have a volume reduction of 30% by week 4 Date Initiated: 06/11/2021 Target  Resolution Date: 08/11/2021 Goal Status: Active Ulcer/skin breakdown will have a volume reduction of 50% by week 8 Date Initiated: 06/11/2021 Target Resolution Date: 09/11/2021 Goal Status: Active Ulcer/skin breakdown will have a volume reduction of 80% by week 12 Date Initiated: 06/11/2021 Target Resolution Date: 10/09/2021 Goal Status: Active Ulcer/skin breakdown will heal within 14 weeks Date Initiated: 06/11/2021 Target Resolution Date: 11/09/2021 Goal Status: Active Interventions: Assess patient/caregiver ability to obtain necessary supplies Assess patient/caregiver ability to perform ulcer/skin care regimen upon admission and as needed Assess ulceration(s) every visit Notes: Electronic Signature(s) Signed: 08/03/2021 4:17:41 PM By: Carlene Coria RN Entered By: Carlene Coria on 07/31/2021 12:55:13 Julia Ashley ([id number]) -------------------------------------------------------------------------------- Pain Assessment Details Patient Name: Julia Ashley Date of Service: 07/31/2021 12:30 PM Medical Record Number: [id number] Patient Account Number: [id number] Date of Birth/Sex: May 08, 1966 (55 y.o. F) Treating RN: Carlene Coria Primary Care Lucio Litsey: Gemma Payor Other Clinician: Referring Zakariye Nee: Gemma Payor Treating Malia Corsi/Extender: Tito Dine in Treatment: 7 Active Problems Location of Pain Severity and Description of Pain Patient Has Paino No Site Locations Pain Management and Medication Current Pain Management: Electronic Signature(s) Signed: 08/03/2021 4:17:41 PM By: Carlene Coria RN Entered By: Carlene Coria on 07/31/2021 12:43:09 Julia Ashley ([id number]) -------------------------------------------------------------------------------- Patient/Caregiver Education Details Patient Name: Julia Ashley Date of Service: 07/31/2021 12:30 PM Medical Record Number: [id number] Patient Account Number: [id number] Date of Birth/Gender: September 29, 1965  (55 y.o. F) Treating RN: Carlene Coria Primary Care Physician: Gemma Payor Other Clinician: Referring Physician: Gemma Payor Treating Physician/Extender: Tito Dine in Treatment: 7 Education Assessment Education Provided To: Patient Education Topics Provided Wound/Skin Impairment: Methods: Explain/Verbal Responses: State content correctly Electronic Signature(s) Signed: 08/03/2021 4:17:41 PM By: Carlene Coria RN Entered By: Carlene Coria on 07/31/2021 13:10:57 Julia Ashley ([id number]) -------------------------------------------------------------------------------- Wound Assessment Details Patient Name: Julia Ashley Date of Service: 07/31/2021 12:30 PM Medical Record Number: [id number] Patient Account Number: [id number] Date of Birth/Sex: 1965/09/15 (55 y.o. F) Treating RN: Carlene Coria Primary Care Allie Gerhold: Gemma Payor Other Clinician: Referring Khamora Karan: Gemma Payor Treating Kylah Maresh/Extender: Tito Dine in Treatment: 7 Wound Status Wound Number: 1 Primary Etiology: Trauma, Other Wound Location: Right, Medial Lower Leg Wound Status: Open Wounding Event: Trauma Comorbid History: Hypertension Date Acquired: 05/30/2021 Weeks Of Treatment: 7 Clustered Wound: No Photos Wound Measurements Length: (cm) 3.5 Width: (cm) 1.3 Depth: (cm) 0.2 Area: (cm) 3.574 Volume: (cm) 0.715 % Reduction in Area: 92.2% % Reduction in Volume: 94.8% Epithelialization: Small (1-33%) Tunneling: No Undermining: No Wound Description Classification: Full Thickness Without Exposed Support Structures Exudate Amount: Medium Exudate Type: Serosanguineous Exudate Color: red, brown Foul Odor After Cleansing: No Slough/Fibrino No Wound Bed Granulation Amount: Large (67-100%) Exposed Structure Granulation Quality: Red, Pink Fascia Exposed: No Necrotic Amount: None Present (0%) Fat Layer (Subcutaneous Tissue) Exposed: Yes Tendon  Exposed: No Muscle Exposed: No Joint Exposed: No Bone Exposed: No Treatment Notes Wound #1 (Lower Leg) Wound Laterality: Right, Medial  Cleanser Normal Saline Discharge Instruction: Wash your hands with soap and water. Remove old dressing, discard into plastic bag and place into trash. Cleanse the wound with Normal Saline prior to applying a clean dressing using gauze sponges, not tissues or cotton balls. Do not scrub or use excessive force. Pat dry using gauze sponges, not tissue or cotton balls. Soap and [place], Julia Ashley ([id number]) Discharge Instruction: Gently cleanse wound with antibacterial soap, rinse and pat dry prior to dressing wounds Julia Ashley Discharge Instruction: Apply AandD Ointment as directed Topical Primary Dressing Prisma 4.34 (in) Discharge Instruction: Moisten w/normal saline or sterile water; Cover wound as directed. Do not remove from wound bed. Secondary Dressing ABD Pad 5x9 (in/in) Discharge Instruction: Cover with ABD pad Secured With Compression Wrap Medichoice 4 layer Compression System, 35-40 mmHG Discharge Instruction: Apply multi-layer wrap as directed. Compression Stockings Add-Ons Electronic Signature(s) Signed: 08/03/2021 4:17:41 PM By: Carlene Coria RN Entered By: Carlene Coria on 07/31/2021 12:53:06 Julia Ashley ([id number]) -------------------------------------------------------------------------------- Vitals Details Patient Name: Julia Ashley Date of Service: 07/31/2021 12:30 PM Medical Record Number: [id number] Patient Account Number: [id number] Date of Birth/Sex: Oct 28, 1965 (55 y.o. F) Treating RN: Carlene Coria Primary Care Nya Monds: Gemma Payor Other Clinician: Referring Corby Vandenberghe: Gemma Payor Treating Khaleelah Yowell/Extender: Tito Dine in Treatment: 7 Vital Signs Time Taken: 12:41 Temperature (F): 98.3 Height (in): 72 Pulse (bpm): 94 Weight (lbs): 277 Respiratory Rate  (breaths/min): 18 Body Mass Index (BMI): 37.6 Blood Pressure (mmHg): 175/107 Reference Range: 80 - 120 mg / dl Notes patient reports she did not take medications for blood pressure, forgot. denies lightheadedness, confusion, ringing of ears, burred vision, notified Dr Dellia Nims Electronic Signature(s) Signed: 08/03/2021 4:17:41 PM By: Carlene Coria RN Entered By: Carlene Coria on 07/31/2021 12:42:42
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
## 2021-08-07 ENCOUNTER — Other Ambulatory Visit: Payer: Self-pay

## 2021-08-07 ENCOUNTER — Encounter: Payer: BC Managed Care – PPO | Attending: Physician Assistant | Admitting: Physician Assistant

## 2021-08-07 DIAGNOSIS — L97812 Non-pressure chronic ulcer of other part of right lower leg with fat layer exposed: Secondary | ICD-10-CM | POA: Insufficient documentation

## 2021-08-07 DIAGNOSIS — I10 Essential (primary) hypertension: Secondary | ICD-10-CM | POA: Insufficient documentation

## 2021-08-07 DIAGNOSIS — W228XXA Striking against or struck by other objects, initial encounter: Secondary | ICD-10-CM | POA: Insufficient documentation

## 2021-08-07 DIAGNOSIS — Z09 Encounter for follow-up examination after completed treatment for conditions other than malignant neoplasm: Secondary | ICD-10-CM | POA: Insufficient documentation

## 2021-08-07 DIAGNOSIS — S81811A Laceration without foreign body, right lower leg, initial encounter: Secondary | ICD-10-CM | POA: Insufficient documentation
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
## 2021-08-07 NOTE — Progress Notes (Signed)
HARIKA, LAIDLAW ([id number]) Visit Report for 08/07/2021 Arrival Information Details Patient Name: Julia Ashley, Julia Ashley Date of Service: 08/07/2021 11:00 AM Medical Record Number: [id number] Patient Account Number: [id number] Date of Birth/Sex: 1966-01-16 (56 y.o. F) Treating RN: Carlene Coria Primary Care Tye Vigo: Gemma Payor Other Clinician: Referring Manuella Blackson: Gemma Payor Treating Anushree Dorsi/Extender: Skipper Cliche in Treatment: 8 Visit Information History Since Last Visit All ordered tests and consults were completed: No Patient Arrived: Ambulatory Added or deleted any medications: No Arrival Time: 11:02 Any new allergies or adverse reactions: No Accompanied By: self Had a fall or experienced change in No Transfer Assistance: None activities of daily living that may affect Patient Identification Verified: Yes risk of falls: Secondary Verification Process Completed: Yes Signs or symptoms of abuse/neglect since last visito No Patient Requires Transmission-Based Precautions: No Hospitalized since last visit: No Patient Has Alerts: No Implantable device outside of the clinic excluding No cellular tissue based products placed in the center since last visit: Has Dressing in Place as Prescribed: Yes Has Compression in Place as Prescribed: Yes Pain Present Now: No Electronic Signature(s) Signed: 08/07/2021 3:55:11 PM By: Carlene Coria RN Entered By: Carlene Coria on 08/07/2021 11:07:14 Julia Ashley ([id number]) -------------------------------------------------------------------------------- Clinic Level of Care Assessment Details Patient Name: Julia Ashley Date of Service: 08/07/2021 11:00 AM Medical Record Number: [id number] Patient Account Number: [id number] Date of Birth/Sex: 29-Sep-1965 (56 y.o. F) Treating RN: Carlene Coria Primary Care Timoth Schara: Gemma Payor Other Clinician: Referring Niasia Lanphear: Gemma Payor Treating Mihaela Fajardo/Extender: Skipper Cliche in Treatment: 8 Clinic Level of Care Assessment Items TOOL 1 Quantity Score '[]'  - Use when EandM and Procedure is performed on INITIAL visit 0 ASSESSMENTS - Nursing Assessment / Reassessment '[]'  - General Physical Exam (combine w/ comprehensive assessment (listed just below) when performed on new 0 pt. evals) '[]'  - 0 Comprehensive Assessment (HX, ROS, Risk Assessments, Wounds Hx, etc.) ASSESSMENTS - Wound and Skin Assessment / Reassessment '[]'  - Dermatologic / Skin Assessment (not related to wound area) 0 ASSESSMENTS - Ostomy and/or Continence Assessment and Care '[]'  - Incontinence Assessment and Management 0 '[]'  - 0 Ostomy Care Assessment and Management (repouching, etc.) PROCESS - Coordination of Care '[]'  - Simple Patient / Family Education for ongoing care 0 '[]'  - 0 Complex (extensive) Patient / Family Education for ongoing care '[]'  - 0 Staff obtains Programmer, systems, Records, Test Results / Process Orders '[]'  - 0 Staff telephones HHA, Nursing Homes / Clarify orders / etc '[]'  - 0 Routine Transfer to another Facility (non-emergent condition) '[]'  - 0 Routine Hospital Admission (non-emergent condition) '[]'  - 0 New Admissions / Biomedical engineer / Ordering NPWT, Apligraf, etc. '[]'  - 0 Emergency Hospital Admission (emergent condition) PROCESS - Special Needs '[]'  - Pediatric / Minor Patient Management 0 '[]'  - 0 Isolation Patient Management '[]'  - 0 Hearing / Language / Visual special needs '[]'  - 0 Assessment of Community assistance (transportation, D/C planning, etc.) '[]'  - 0 Additional assistance / Altered mentation '[]'  - 0 Support Surface(s) Assessment (bed, cushion, seat, etc.) INTERVENTIONS - Miscellaneous '[]'  - External ear exam 0 '[]'  - 0 Patient Transfer (multiple staff / Civil Service fast streamer / Similar devices) '[]'  - 0 Simple Staple / Suture removal (25 or less) '[]'  - 0 Complex Staple / Suture removal (26 or more) '[]'  - 0 Hypo/Hyperglycemic Management (do not check if billed  separately) '[]'  - 0 Ankle / Brachial Index (ABI) - do not check if billed separately Has the patient been seen at the hospital within the last three years: Yes Total  Score: 0 Level Of Care: ____ Julia Ashley ([id number]) Electronic Signature(s) Signed: 08/07/2021 3:55:11 PM By: Carlene Coria RN Entered By: Carlene Coria on 08/07/2021 11:47:09 Julia Ashley ([id number]) -------------------------------------------------------------------------------- Compression Therapy Details Patient Name: Julia Ashley Date of Service: 08/07/2021 11:00 AM Medical Record Number: [id number] Patient Account Number: [id number] Date of Birth/Sex: Oct 04, 1965 (56 y.o. F) Treating RN: Carlene Coria Primary Care Astoria Condon: Gemma Payor Other Clinician: Referring Leontina Skidmore: Gemma Payor Treating Meyah Corle/Extender: Skipper Cliche in Treatment: 8 Compression Therapy Performed for Wound Assessment: Wound #1 Right,Medial Lower Leg Performed By: Clinician Carlene Coria, RN Compression Type: Four Layer Post Procedure Diagnosis Same as Pre-procedure Electronic Signature(s) Signed: 08/07/2021 3:55:11 PM By: Carlene Coria RN Entered By: Carlene Coria on 08/07/2021 11:31:33 Julia Ashley ([id number]) -------------------------------------------------------------------------------- Encounter Discharge Information Details Patient Name: Julia Ashley Date of Service: 08/07/2021 11:00 AM Medical Record Number: [id number] Patient Account Number: [id number] Date of Birth/Sex: March 20, 1966 (56 y.o. F) Treating RN: Carlene Coria Primary Care Nyssa Sayegh: Gemma Payor Other Clinician: Referring Quetzali Heinle: Gemma Payor Treating Armany Mano/Extender: Skipper Cliche in Treatment: 8 Encounter Discharge Information Items Discharge Condition: Stable Ambulatory Status: Ambulatory Discharge Destination: Home Transportation: Private Auto Accompanied By: self Schedule Follow-up Appointment: Yes Clinical  Summary of Care: Patient Declined Electronic Signature(s) Signed: 08/07/2021 11:50:35 AM By: Carlene Coria RN Entered By: Carlene Coria on 08/07/2021 11:50:35 Julia Ashley ([id number]) -------------------------------------------------------------------------------- Lower Extremity Assessment Details Patient Name: Julia Ashley Date of Service: 08/07/2021 11:00 AM Medical Record Number: [id number] Patient Account Number: [id number] Date of Birth/Sex: Dec 27, 1965 (56 y.o. F) Treating RN: Carlene Coria Primary Care Zephaniah Lubrano: Gemma Payor Other Clinician: Referring Tavi Hoogendoorn: Gemma Payor Treating Jyl Chico/Extender: Skipper Cliche in Treatment: 8 Edema Assessment Assessed: [Left: No] [Right: No] [Left: Edema] [Right: :] Calf Left: Right: Point of Measurement: 34 cm From Medial Instep 44 cm Ankle Left: Right: Point of Measurement: 11 cm From Medial Instep 27 cm Vascular Assessment Pulses: Dorsalis Pedis Palpable: [Right:Yes] Electronic Signature(s) Signed: 08/07/2021 3:55:11 PM By: Carlene Coria RN Entered By: Carlene Coria on 08/07/2021 11:19:06 Julia Ashley ([id number]) -------------------------------------------------------------------------------- Multi Wound Chart Details Patient Name: Julia Ashley Date of Service: 08/07/2021 11:00 AM Medical Record Number: [id number] Patient Account Number: [id number] Date of Birth/Sex: [birth year] (56 y.o. F) Treating RN: Carlene Coria Primary Care Adajah Cocking: Gemma Payor Other Clinician: Referring Desirie Minteer: Gemma Payor Treating Orville Widmann/Extender: Skipper Cliche in Treatment: 8 Vital Signs Height(in): 72 Pulse(bpm): 99 Weight(lbs): 297 Blood Pressure(mmHg): 179/109 Body Mass Index(BMI): 38 Temperature(F): 98.3 Respiratory Rate(breaths/min): 18 Photos: [N/A:N/A] Wound Location: Right, Medial Lower Leg N/A N/A Wounding Event: Trauma N/A N/A Primary Etiology: Trauma, Other N/A N/A Comorbid History:  Hypertension N/A N/A Date Acquired: 05/30/2021 N/A N/A Weeks of Treatment: 8 N/A N/A Wound Status: Open N/A N/A Measurements L x W x D (cm) 1.6x0.5x0.1 N/A N/A Area (cm) : 0.628 N/A N/A Volume (cm) : 0.063 N/A N/A % Reduction in Area: 98.60% N/A N/A % Reduction in Volume: 99.50% N/A N/A Classification: Full Thickness Without Exposed N/A N/A Support Structures Exudate Amount: Medium N/A N/A Exudate Type: Serosanguineous N/A N/A Exudate Color: red, brown N/A N/A Granulation Amount: Large (67-100%) N/A N/A Granulation Quality: Red, Pink N/A N/A Necrotic Amount: None Present (0%) N/A N/A Exposed Structures: Fat Layer (Subcutaneous Tissue): N/A N/A Yes Fascia: No Tendon: No Muscle: No Joint: No Bone: No Epithelialization: Small (1-33%) N/A N/A Treatment Notes Electronic Signature(s) Signed: 08/07/2021 3:55:11 PM By: Carlene Coria RN Entered By: Carlene Coria on 08/07/2021 11:31:05 Julia Ashley ([id number]) -------------------------------------------------------------------------------- [place] Details Patient Name: Julia Ashley Date  of Service: 08/07/2021 11:00 AM Medical Record Number: [id number] Patient Account Number: [id number] Date of Birth/Sex: Jan 07, 1966 (56 y.o. F) Treating RN: Carlene Coria Primary Care Merlon Alcorta: Gemma Payor Other Clinician: Referring Kala Ambriz: Gemma Payor Treating Marque Rademaker/Extender: Skipper Cliche in Treatment: 8 Active Inactive Wound/Skin Impairment Nursing Diagnoses: Knowledge deficit related to ulceration/compromised skin integrity Goals: Patient/caregiver will verbalize understanding of skin care regimen Date Initiated: 06/11/2021 Date Inactivated: 06/19/2021 Target Resolution Date: 07/11/2021 Goal Status: Met Ulcer/skin breakdown will have a volume reduction of 30% by week 4 Date Initiated: 06/11/2021 Target Resolution Date: 08/11/2021 Goal Status: Active Ulcer/skin breakdown will have a volume  reduction of 50% by week 8 Date Initiated: 06/11/2021 Target Resolution Date: 09/11/2021 Goal Status: Active Ulcer/skin breakdown will have a volume reduction of 80% by week 12 Date Initiated: 06/11/2021 Target Resolution Date: 10/09/2021 Goal Status: Active Ulcer/skin breakdown will heal within 14 weeks Date Initiated: 06/11/2021 Target Resolution Date: 11/09/2021 Goal Status: Active Interventions: Assess patient/caregiver ability to obtain necessary supplies Assess patient/caregiver ability to perform ulcer/skin care regimen upon admission and as needed Assess ulceration(s) every visit Notes: Electronic Signature(s) Signed: 08/07/2021 3:55:11 PM By: Carlene Coria RN Previous Signature: 08/07/2021 11:29:42 AM Version By: Carlene Coria RN Entered By: Carlene Coria on 08/07/2021 11:30:39 Julia Ashley ([id number]) -------------------------------------------------------------------------------- Pain Assessment Details Patient Name: Julia Ashley Date of Service: 08/07/2021 11:00 AM Medical Record Number: [id number] Patient Account Number: [id number] Date of Birth/Sex: 05/18/1966 (56 y.o. F) Treating RN: Carlene Coria Primary Care Elif Yonts: Gemma Payor Other Clinician: Referring Teria Khachatryan: Gemma Payor Treating Kashayla Ungerer/Extender: Skipper Cliche in Treatment: 8 Active Problems Location of Pain Severity and Description of Pain Patient Has Paino No Site Locations Pain Management and Medication Current Pain Management: Electronic Signature(s) Signed: 08/07/2021 3:55:11 PM By: Carlene Coria RN Entered By: Carlene Coria on 08/07/2021 11:16:32 Julia Ashley ([id number]) -------------------------------------------------------------------------------- Patient/Caregiver Education Details Patient Name: Julia Ashley Date of Service: 08/07/2021 11:00 AM Medical Record Number: [id number] Patient Account Number: [id number] Date of Birth/Gender: 1965-11-10 (56 y.o. F) Treating RN: Carlene Coria Primary Care Physician: Gemma Payor Other Clinician: Referring Physician: Gemma Payor Treating Physician/Extender: Skipper Cliche in Treatment: 8 Education Assessment Education Provided To: Patient Education Topics Provided Wound/Skin Impairment: Methods: Explain/Verbal Responses: State content correctly Electronic Signature(s) Signed: 08/07/2021 3:55:11 PM By: Carlene Coria RN Entered By: Carlene Coria on 08/07/2021 11:48:51 Julia Ashley ([id number]) -------------------------------------------------------------------------------- Wound Assessment Details Patient Name: Julia Ashley Date of Service: 08/07/2021 11:00 AM Medical Record Number: [id number] Patient Account Number: [id number] Date of Birth/Sex: [birth year] (56 y.o. F) Treating RN: Carlene Coria Primary Care Dorn Hartshorne: Gemma Payor Other Clinician: Referring Sharmila Wrobleski: Gemma Payor Treating Yeudiel Mateo/Extender: Skipper Cliche in Treatment: 8 Wound Status Wound Number: 1 Primary Etiology: Trauma, Other Wound Location: Right, Medial Lower Leg Wound Status: Open Wounding Event: Trauma Comorbid History: Hypertension Date Acquired: 05/30/2021 Weeks Of Treatment: 8 Clustered Wound: No Photos Wound Measurements Length: (cm) 1.6 Width: (cm) 0.5 Depth: (cm) 0.1 Area: (cm) 0.628 Volume: (cm) 0.063 % Reduction in Area: 98.6% % Reduction in Volume: 99.5% Epithelialization: Small (1-33%) Tunneling: No Undermining: No Wound Description Classification: Full Thickness Without Exposed Support Structures Exudate Amount: Medium Exudate Type: Serosanguineous Exudate Color: red, brown Foul Odor After Cleansing: No Slough/Fibrino No Wound Bed Granulation Amount: Large (67-100%) Exposed Structure Granulation Quality: Red, Pink Fascia Exposed: No Necrotic Amount: None Present (0%) Fat Layer (Subcutaneous Tissue) Exposed: Yes Tendon Exposed: No Muscle Exposed: No Joint Exposed:  No Bone Exposed: No Treatment Notes Wound #1 (Lower Leg) Wound Laterality: Right, Medial Cleanser  Normal Saline Discharge Instruction: Wash your hands with soap and water. Remove old dressing, discard into plastic bag and place into trash. Cleanse the wound with Normal Saline prior to applying a clean dressing using gauze sponges, not tissues or cotton balls. Do not scrub or use excessive force. Pat dry using gauze sponges, not tissue or cotton balls. Soap and [place], [place] ([id number]) Discharge Instruction: Gently cleanse wound with antibacterial soap, rinse and pat dry prior to dressing wounds [place] Discharge Instruction: Apply AandD Ointment as directed Topical Primary Dressing Prisma 4.34 (in) Discharge Instruction: Moisten w/normal saline or sterile water; Cover wound as directed. Do not remove from wound bed. Secondary Dressing ABD Pad 5x9 (in/in) Discharge Instruction: Cover with ABD pad Secured With Compression Wrap Medichoice 4 layer Compression System, 35-40 mmHG Discharge Instruction: Apply multi-layer wrap as directed. Compression Stockings Add-Ons Electronic Signature(s) Signed: 08/07/2021 3:55:11 PM By: Carlene Coria RN Entered By: Carlene Coria on 08/07/2021 11:17:57 Julia Ashley ([id number]) -------------------------------------------------------------------------------- Vitals Details Patient Name: Julia Ashley Date of Service: 08/07/2021 11:00 AM Medical Record Number: [id number] Patient Account Number: [id number] Date of Birth/Sex: February 01, 1966 (56 y.o. F) Treating RN: Carlene Coria Primary Care Pierina Schuknecht: Gemma Payor Other Clinician: Referring Glennette Galster: Gemma Payor Treating Alexzia Kasler/Extender: Skipper Cliche in Treatment: 8 Vital Signs Time Taken: 11:07 Temperature (F): 98.3 Height (in): 72 Pulse (bpm): 99 Weight (lbs): 277 Respiratory Rate (breaths/min): 18 Body Mass Index (BMI): 37.6 Blood  Pressure (mmHg): 179/109 Reference Range: 80 - 120 mg / dl Electronic Signature(s) Signed: 08/07/2021 3:55:11 PM By: Carlene Coria RN Entered By: Carlene Coria on 08/07/2021 11:07:37

## 2021-08-14 ENCOUNTER — Other Ambulatory Visit: Payer: Self-pay

## 2021-08-14 ENCOUNTER — Encounter: Payer: BC Managed Care – PPO | Admitting: Physician Assistant

## 2021-08-14 DIAGNOSIS — I10 Essential (primary) hypertension: Secondary | ICD-10-CM | POA: Diagnosis not present

## 2021-08-14 DIAGNOSIS — S81811A Laceration without foreign body, right lower leg, initial encounter: Secondary | ICD-10-CM | POA: Diagnosis not present

## 2021-08-14 DIAGNOSIS — L97812 Non-pressure chronic ulcer of other part of right lower leg with fat layer exposed: Secondary | ICD-10-CM | POA: Diagnosis not present

## 2021-08-14 DIAGNOSIS — Z09 Encounter for follow-up examination after completed treatment for conditions other than malignant neoplasm: Secondary | ICD-10-CM | POA: Diagnosis not present

## 2021-08-14 DIAGNOSIS — W228XXA Striking against or struck by other objects, initial encounter: Secondary | ICD-10-CM | POA: Diagnosis not present

## 2021-08-14 NOTE — Progress Notes (Signed)
Julia Ashley, Julia Ashley ([id number]) Visit Report for 08/14/2021 Arrival Information Details Patient Name: Julia Ashley, Julia Ashley Date of Service: 08/14/2021 8:00 AM Medical Record Number: [id number] Patient Account Number: [id number] Date of Birth/Sex: 10/30/1965 (56 y.o. F) Treating RN: Yevonne PaxEpps, Carrie Primary Care Yaniah Thiemann: Aurora MaskHatharasinghe, Roger Other Clinician: Referring Tyreik Delahoussaye: Aurora MaskHatharasinghe, Roger Treating Glynda Soliday/Extender: Rowan BlaseStone, Hoyt Weeks in Treatment: 9 Visit Information History Since Last Visit All ordered tests and consults were completed: No Patient Arrived: Ambulatory Added or deleted any medications: No Arrival Time: 08:06 Any new allergies or adverse reactions: No Accompanied By: self Had a fall or experienced change in No Transfer Assistance: None activities of daily living that may affect Patient Identification Verified: Yes risk of falls: Secondary Verification Process Completed: Yes Signs or symptoms of abuse/neglect since last visito No Patient Requires Transmission-Based Precautions: No Hospitalized since last visit: No Patient Has Alerts: No Implantable device outside of the clinic excluding No cellular tissue based products placed in the center since last visit: Has Dressing in Place as Prescribed: Yes Has Compression in Place as Prescribed: Yes Pain Present Now: No Electronic Signature(s) Signed: 08/14/2021 8:45:44 AM By: Yevonne PaxEpps, Carrie RN Entered By: Yevonne PaxEpps, Carrie on 08/14/2021 08:15:21 Julia Ashley, Julia Ashley ([id number]) -------------------------------------------------------------------------------- Clinic Level of Care Assessment Details Patient Name: Julia Ashley, Julia Ashley Date of Service: 08/14/2021 8:00 AM Medical Record Number: [id number] Patient Account Number: [id number] Date of Birth/Sex: 05/15/1966 (56 y.o. F) Treating RN: Yevonne PaxEpps, Carrie Primary Care Halima Fogal: Aurora MaskHatharasinghe, Roger Other Clinician: Referring Bevan Disney: Aurora MaskHatharasinghe, Roger Treating Mansfield Dann/Extender: Rowan BlaseStone,  Hoyt Weeks in Treatment: 9 Clinic Level of Care Assessment Items TOOL 4 Quantity Score X - Use when only an EandM is performed on FOLLOW-UP visit 1 0 ASSESSMENTS - Nursing Assessment / Reassessment X - Reassessment of Co-morbidities (includes updates in patient status) 1 10 X- 1 5 Reassessment of Adherence to Treatment Plan ASSESSMENTS - Wound and Skin Assessment / Reassessment X - Simple Wound Assessment / Reassessment - one wound 1 5 []  - 0 Complex Wound Assessment / Reassessment - multiple wounds []  - 0 Dermatologic / Skin Assessment (not related to wound area) ASSESSMENTS - Focused Assessment []  - Circumferential Edema Measurements - multi extremities 0 []  - 0 Nutritional Assessment / Counseling / Intervention []  - 0 Lower Extremity Assessment (monofilament, tuning fork, pulses) []  - 0 Peripheral Arterial Disease Assessment (using hand held doppler) ASSESSMENTS - Ostomy and/or Continence Assessment and Care []  - Incontinence Assessment and Management 0 []  - 0 Ostomy Care Assessment and Management (repouching, etc.) PROCESS - Coordination of Care X - Simple Patient / Family Education for ongoing care 1 15 []  - 0 Complex (extensive) Patient / Family Education for ongoing care []  - 0 Staff obtains ChiropractorConsents, Records, Test Results / Process Orders []  - 0 Staff telephones HHA, Nursing Homes / Clarify orders / etc []  - 0 Routine Transfer to another Facility (non-emergent condition) []  - 0 Routine Hospital Admission (non-emergent condition) []  - 0 New Admissions / Manufacturing engineernsurance Authorizations / Ordering NPWT, Apligraf, etc. []  - 0 Emergency Hospital Admission (emergent condition) X- 1 10 Simple Discharge Coordination []  - 0 Complex (extensive) Discharge Coordination PROCESS - Special Needs []  - Pediatric / Minor Patient Management 0 []  - 0 Isolation Patient Management []  - 0 Hearing / Language / Visual special needs []  - 0 Assessment of Community assistance  (transportation, D/C planning, etc.) []  - 0 Additional assistance / Altered mentation []  - 0 Support Surface(s) Assessment (bed, cushion, seat, etc.) INTERVENTIONS - Wound Cleansing / Measurement [place]ata[place]WILLIAMSON, Julia Ashley ([id number]) X- 1 5 Simple Wound  Cleansing - one wound []  - 0 Complex Wound Cleansing - multiple wounds X- 1 5 Wound Imaging (photographs - any number of wounds) []  - 0 Wound Tracing (instead of photographs) X- 1 5 Simple Wound Measurement - one wound []  - 0 Complex Wound Measurement - multiple wounds INTERVENTIONS - Wound Dressings []  - Small Wound Dressing one or multiple wounds 0 []  - 0 Medium Wound Dressing one or multiple wounds []  - 0 Large Wound Dressing one or multiple wounds []  - 0 Application of Medications - topical []  - 0 Application of Medications - injection INTERVENTIONS - Miscellaneous []  - External ear exam 0 []  - 0 Specimen Collection (cultures, biopsies, blood, body fluids, etc.) []  - 0 Specimen(s) / Culture(s) sent or taken to Lab for analysis []  - 0 Patient Transfer (multiple staff / / Similar devices) []  - 0 Simple Staple / Suture removal (25 or less) []  - 0 Complex Staple / Suture removal (26 or more) []  - 0 Hypo / Hyperglycemic Management (close monitor of Blood Glucose) []  - 0 Ankle / Brachial Index (ABI) - do not check if billed separately X- 1 5 Vital Signs Has the patient been seen at the hospital within the last three years: Yes Total Score: 65 Level Of Care: New/Established - Level 2 Electronic Signature(s) Signed: 08/14/2021 8:45:44 AM By: RN Entered By: on 08/14/2021 08:39:41 ( ) -------------------------------------------------------------------------------- Encounter Discharge Information Details Patient Name: Date of Service: 08/14/2021 8:00 AM Medical Record Number: Patient Account Number: Date of Birth/Sex: 04-18-1966  (56 y.o. F) Treating RN: Primary Care Azaria Stegman: Other Clinician: Referring Murtaza Shell: Treating Caroline Longie/Extender: in Treatment: 9 Encounter Discharge Information Items Discharge Condition: Stable Ambulatory Status: Ambulatory Discharge Destination: Home Transportation: Private Auto Accompanied By: self Schedule Follow-up Appointment: Yes Clinical Summary of Care: Electronic Signature(s) Signed: 08/14/2021 8:45:44 AM By: Yevonne Pax RN Entered By: Yevonne Pax on 08/14/2021 08:42:57 Julia Ashley ([id number]) -------------------------------------------------------------------------------- Lower Extremity Assessment Details Patient Name: Julia Ashley Date of Service: 08/14/2021 8:00 AM Medical Record Number: [id number] Patient Account Number: [id number] Date of Birth/Sex: [birth year] (56 y.o. F) Treating RN: Yevonne Pax Primary Care Alaric Gladwin: Aurora Mask Other Clinician: Referring Tamaira Ciriello: Aurora Mask Treating Tiaira Arambula/Extender: Rowan Blase in Treatment: 9 Edema Assessment Assessed: [Left: No] [Right: No] Edema: [Left: Ye] [Right: s] Calf Left: Right: Point of Measurement: 34 cm From Medial Instep 47 cm Ankle Left: Right: Point of Measurement: 11 cm From Medial Instep 27 cm Vascular Assessment Pulses: Dorsalis Pedis Palpable: [Right:Yes] Electronic Signature(s) Signed: 08/14/2021 8:45:44 AM By: Yevonne Pax RN Entered By: Yevonne Pax on 08/14/2021 08:28:16 Julia Ashley ([id number]) -------------------------------------------------------------------------------- Multi Wound Chart Details Patient Name: Julia Ashley Date of Service: 08/14/2021 8:00 AM Medical Record Number: [id number] Patient Account Number: [id number] Date of Birth/Sex: 1965/09/11 (56 y.o. F) Treating RN: Yevonne Pax Primary Care Nosson Wender: Aurora Mask Other Clinician: Referring Taylon Louison:  Aurora Mask Treating Emmett Arntz/Extender: Rowan Blase in Treatment: 9 Vital Signs Height(in): 72 Pulse(bpm): 98 Weight(lbs): 277 Blood Pressure(mmHg): 145/87 Body Mass Index(BMI): 38 Temperature(F): 98.2 Respiratory Rate(breaths/min): 18 Photos: [N/A:N/A] Wound Location: Right, Medial Lower Leg N/A N/A Wounding Event: Trauma N/A N/A Primary Etiology: Trauma, Other N/A N/A Comorbid History: Hypertension N/A N/A Date Acquired: 05/30/2021 N/A N/A Weeks of Treatment: 9 N/A N/A Wound Status: Open N/A N/A Measurements L x W x D (cm) 0x0x0 N/A N/A Area (cm) : 0 N/A N/A Volume (cm) : 0 N/A  N/A % Reduction in Area: 100.00% N/A N/A % Reduction in Volume: 100.00% N/A N/A Classification: Full Thickness Without Exposed N/A N/A Support Structures Exudate Amount: Medium N/A N/A Exudate Type: Serosanguineous N/A N/A Exudate Color: red, brown N/A N/A Granulation Amount: Large (67-100%) N/A N/A Granulation Quality: Red, Pink N/A N/A Necrotic Amount: None Present (0%) N/A N/A Exposed Structures: Fat Layer (Subcutaneous Tissue): N/A N/A Yes Fascia: No Tendon: No Muscle: No Joint: No Bone: No Epithelialization: Small (1-33%) N/A N/A Treatment Notes Electronic Signature(s) Signed: 08/14/2021 8:45:44 AM By: Yevonne Pax RN Entered By: Yevonne Pax on 08/14/2021 08:37:29 Julia Ashley ([id number]) -------------------------------------------------------------------------------- Multi-Disciplinary Care Plan Details Patient Name: Julia Ashley Date of Service: 08/14/2021 8:00 AM Medical Record Number: [id number] Patient Account Number: [id number] Date of Birth/Sex: [birth year] (56 y.o. F) Treating RN: Yevonne Pax Primary Care Kim Oki: Aurora Mask Other Clinician: Referring Tanazia Achee: Aurora Mask Treating Annison Birchard/Extender: Rowan Blase in Treatment: 9 Active Inactive Electronic Signature(s) Signed: 08/14/2021 8:45:44 AM By: Yevonne Pax  RN Entered By: Yevonne Pax on 08/14/2021 08:37:10 Julia Ashley ([id number]) -------------------------------------------------------------------------------- Pain Assessment Details Patient Name: Julia Ashley Date of Service: 08/14/2021 8:00 AM Medical Record Number: [id number] Patient Account Number: [id number] Date of Birth/Sex: 09-05-1965 (56 y.o. F) Treating RN: Yevonne Pax Primary Care Calvin Jablonowski: Aurora Mask Other Clinician: Referring Mack Alvidrez: Aurora Mask Treating Shakiyla Kook/Extender: Rowan Blase in Treatment: 9 Active Problems Location of Pain Severity and Description of Pain Patient Has Paino No Site Locations Pain Management and Medication Current Pain Management: Electronic Signature(s) Signed: 08/14/2021 8:45:44 AM By: Yevonne Pax RN Entered By: Yevonne Pax on 08/14/2021 08:16:15 Julia Ashley ([id number]) -------------------------------------------------------------------------------- Wound Assessment Details Patient Name: Julia Ashley Date of Service: 08/14/2021 8:00 AM Medical Record Number: [id number] Patient Account Number: [id number] Date of Birth/Sex: Mar 08, 1966 (56 y.o. F) Treating RN: Yevonne Pax Primary Care Aviannah Castoro: Aurora Mask Other Clinician: Referring Dezyre Hoefer: Aurora Mask Treating Cathan Gearin/Extender: Rowan Blase in Treatment: 9 Wound Status Wound Number: 1 Primary Etiology: Trauma, Other Wound Location: Right, Medial Lower Leg Wound Status: Open Wounding Event: Trauma Comorbid History: Hypertension Date Acquired: 05/30/2021 Weeks Of Treatment: 9 Clustered Wound: No Photos Wound Measurements Length: (cm) Width: (cm) Depth: (cm) Area: (cm) Volume: (cm) 0 % Reduction in Area: 100% 0 % Reduction in Volume: 100% 0 Epithelialization: Small (1-33%) 0 Tunneling: No 0 Undermining: No Wound Description Classification: Full Thickness Without Exposed Support Structu Exudate Amount:  Medium Exudate Type: Serosanguineous Exudate Color: red, brown res Foul Odor After Cleansing: No Slough/Fibrino No Wound Bed Granulation Amount: Large (67-100%) Exposed Structure Granulation Quality: Red, Pink Fascia Exposed: No Necrotic Amount: None Present (0%) Fat Layer (Subcutaneous Tissue) Exposed: Yes Tendon Exposed: No Muscle Exposed: No Joint Exposed: No Bone Exposed: No Electronic Signature(s) Signed: 08/14/2021 8:45:44 AM By: Yevonne Pax RN Entered By: Yevonne Pax on 08/14/2021 08:25:23 Julia Ashley ([id number]) -------------------------------------------------------------------------------- Vitals Details Patient Name: Julia Ashley Date of Service: 08/14/2021 8:00 AM Medical Record Number: [id number] Patient Account Number: [id number] Date of Birth/Sex: 22-Jul-1966 (56 y.o. F) Treating RN: Yevonne Pax Primary Care Hettie Roselli: Aurora Mask Other Clinician: Referring Tana Trefry: Aurora Mask Treating Eternity Dexter/Extender: Rowan Blase in Treatment: 9 Vital Signs Time Taken: 08:15 Temperature (F): 98.2 Height (in): 72 Pulse (bpm): 98 Weight (lbs): 277 Respiratory Rate (breaths/min): 18 Body Mass Index (BMI): 37.6 Blood Pressure (mmHg): 145/87 Reference Range: 80 - 120 mg / dl Electronic Signature(s) Signed: 08/14/2021 8:45:44 AM By: Yevonne Pax RN Entered By: Yevonne Pax on 08/14/2021 08:16:03
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
## 2021-08-16 DIAGNOSIS — I10 Essential (primary) hypertension: Secondary | ICD-10-CM | POA: Diagnosis not present

## 2021-08-21 ENCOUNTER — Encounter: Payer: BC Managed Care – PPO | Admitting: Physician Assistant

## 2021-08-28 ENCOUNTER — Encounter: Payer: BC Managed Care – PPO | Admitting: Physician Assistant

## 2021-09-04 ENCOUNTER — Encounter: Payer: BC Managed Care – PPO | Admitting: Physician Assistant

## 2021-09-06 ENCOUNTER — Ambulatory Visit: Payer: BC Managed Care – PPO | Admitting: Podiatry

## 2021-09-06 ENCOUNTER — Other Ambulatory Visit: Payer: Self-pay

## 2021-09-06 ENCOUNTER — Ambulatory Visit (INDEPENDENT_AMBULATORY_CARE_PROVIDER_SITE_OTHER): Payer: BC Managed Care – PPO

## 2021-09-06 ENCOUNTER — Encounter: Payer: Self-pay | Admitting: Podiatry

## 2021-09-06 DIAGNOSIS — M722 Plantar fascial fibromatosis: Secondary | ICD-10-CM

## 2021-09-06 NOTE — Progress Notes (Signed)
°  Subjective:  Patient ID: Julia Ashley, female    DOB: 01-Aug-1966,  MRN: [id number]  Chief Complaint  Patient presents with   Foot Pain    Bilateral heel pain     56 y.o. female presents with the above complaint.  Patient presents with bilateral heel pain.  Patient has been on for quite some time is progressive gotten worse.  Patient hurts with ambulation hurts with taking for step in the morning.  Pain has been on for last few weeks has progressive gotten worse.  She has not seen MRIs prior to seeing me.  She has not tried any other treatment options.  She denies any other acute complaints.   Review of Systems: Negative except as noted in the HPI. Denies N/V/F/Ch.  Past Medical History:  Diagnosis Date   Hypertension     Current Outpatient Medications:    amLODipine (NORVASC) 5 MG tablet, Take 1 tablet by mouth daily., Disp: , Rfl:    folic acid (FOLVITE) 800 MCG tablet, Take 800 mcg by mouth daily., Disp: , Rfl:    hydrOXYzine (ATARAX/VISTARIL) 50 MG tablet, Take 1 tablet (50 mg total) by mouth 3 (three) times daily as needed for itching., Disp: 15 tablet, Rfl: 0   methylPREDNISolone (MEDROL DOSEPAK) 4 MG TBPK tablet, Take Tapered dose as directed (Patient not taking: Reported on 01/08/2021), Disp: 21 tablet, Rfl: 0  Social History   Tobacco Use  Smoking Status Never  Smokeless Tobacco Never    Allergies  Allergen Reactions   Lisinopril Swelling    Hives, swelling, cough, scratchy throat   Objective:  There were no vitals filed for this visit. There is no height or weight on file to calculate BMI. Constitutional Well developed. Well nourished.  Vascular Dorsalis pedis pulses palpable bilaterally. Posterior tibial pulses palpable bilaterally. Capillary refill normal to all digits.  No cyanosis or clubbing noted. Pedal hair growth normal.  Neurologic Normal speech. Oriented to person, place, and time. Epicritic sensation to light touch grossly present bilaterally.   Dermatologic Nails well groomed and normal in appearance. No open wounds. No skin lesions.  Orthopedic: Normal joint ROM without pain or crepitus bilaterally. No visible deformities. Tender to palpation at the calcaneal tuber bilaterally. No pain with calcaneal squeeze bilaterally. Ankle ROM diminished range of motion bilaterally. [place] Test: positive bilaterally.   Radiographs: Taken and reviewed. No acute fractures or dislocations. No evidence of stress fracture.  Plantar heel spur present. Posterior heel spur absent.  Pes planovalgus foot structure noted  Assessment:   1. Plantar fasciitis of right foot   2. Plantar fasciitis of left foot    Plan:  Patient was evaluated and treated and all questions answered.  Plantar Fasciitis, bilaterally - XR reviewed as above.  - Educated on icing and stretching. Instructions given.  - Injection delivered to the plantar fascia as below. - DME: Plantar fascial brace dispensed to support the medial longitudinal arch of the foot and offload pressure from the heel and prevent arch collapse during weightbearing - Pharmacologic management: None  Procedure: Injection Tendon/Ligament Location: Bilateral plantar fascia at the glabrous junction; medial approach. Skin Prep: alcohol Injectate: 0.5 cc 0.5% marcaine plain, 0.5 cc of 1% Lidocaine, 0.5 cc kenalog 10. Disposition: Patient tolerated procedure well. Injection site dressed with a band-aid.  No follow-ups on file.

## 2021-10-04 ENCOUNTER — Other Ambulatory Visit: Payer: Self-pay

## 2021-10-04 ENCOUNTER — Ambulatory Visit: Payer: BC Managed Care – PPO | Admitting: Podiatry

## 2021-10-04 DIAGNOSIS — M722 Plantar fascial fibromatosis: Secondary | ICD-10-CM | POA: Diagnosis not present

## 2021-10-04 DIAGNOSIS — Q666 Other congenital valgus deformities of feet: Secondary | ICD-10-CM

## 2021-10-04 NOTE — Progress Notes (Signed)
?Subjective:  ?Patient ID: Julia Ashley, female    DOB: September 25, 1965,  MRN: [id number] ? ?Chief Complaint  ?Patient presents with  ? Plantar Fasciitis  ?  Pt stated that she still has some discomfort but has had some improvement   ? ? ?56 y.o. female presents with the above complaint.  Patient presents for follow-up of bilateral Planter fasciitis.  She states she is doing a little bit better.  She has noticed an improvement.  She still has a little bit of discomfort.  She would like to discuss next treatment plans.  She does not wear any orthotics.  She still has some residual pain.  She is about 50 to 60% better. ? ? ?Review of Systems: Negative except as noted in the HPI. Denies N/V/F/Ch. ? ?Past Medical History:  ?Diagnosis Date  ? Hypertension   ? ? ?Current Outpatient Medications:  ?  amLODipine (NORVASC) 5 MG tablet, Take 1 tablet by mouth daily., Disp: , Rfl:  ?  folic acid (FOLVITE) 800 MCG tablet, Take 800 mcg by mouth daily., Disp: , Rfl:  ?  hydrOXYzine (ATARAX/VISTARIL) 50 MG tablet, Take 1 tablet (50 mg total) by mouth 3 (three) times daily as needed for itching., Disp: 15 tablet, Rfl: 0 ?  methylPREDNISolone (MEDROL DOSEPAK) 4 MG TBPK tablet, Take Tapered dose as directed (Patient not taking: Reported on 01/08/2021), Disp: 21 tablet, Rfl: 0 ? ?Social History  ? ?Tobacco Use  ?Smoking Status Never  ?Smokeless Tobacco Never  ? ? ?Allergies  ?Allergen Reactions  ? Lisinopril Swelling  ?  Hives, swelling, cough, scratchy throat  ? ?Objective:  ?There were no vitals filed for this visit. ?There is no height or weight on file to calculate BMI. ?Constitutional Well developed. ?Well nourished.  ?Vascular Dorsalis pedis pulses palpable bilaterally. ?Posterior tibial pulses palpable bilaterally. ?Capillary refill normal to all digits.  ?No cyanosis or clubbing noted. ?Pedal hair growth normal.  ?Neurologic Normal speech. ?Oriented to person, place, and time. ?Epicritic sensation to light touch grossly present  bilaterally.  ?Dermatologic Nails well groomed and normal in appearance. ?No open wounds. ?No skin lesions.  ?Orthopedic: Normal joint ROM without pain or crepitus bilaterally. ?No visible deformities. ?Tender to palpation at the calcaneal tuber bilaterally. ?No pain with calcaneal squeeze bilaterally. ?Ankle ROM diminished range of motion bilaterally. ?Silfverskiold Test: positive bilaterally. ? ? ?Gait examination shows pes planovalgus with calcaneovalgus to many toe signs partially recruit the arch with dorsiflexion of the hallux.  ? ?Radiographs: Taken and reviewed. No acute fractures or dislocations. No evidence of stress fracture.  Plantar heel spur present. Posterior heel spur absent.  Pes planovalgus foot structure noted ? ?Assessment:  ? ?1. Plantar fasciitis of right foot   ?2. Plantar fasciitis of left foot   ?3. Pes planovalgus   ? ?Plan:  ?Patient was evaluated and treated and all questions answered. ? ?Plantar Fasciitis, bilaterally ?- XR reviewed as above.  ?- Educated on icing and stretching. Instructions given.  ?-Second injection delivered to the plantar fascia as below. ?- DME: Plantar fascial brace dispensed to support the medial longitudinal arch of the foot and offload pressure from the heel and prevent arch collapse during weightbearing ?- Pharmacologic management: None ? ?Pes planovalgus ?-I explained to patient the etiology of pes planovalgus and relationship with Planter fasciitis and various treatment options were discussed.  Given patient foot structure in the setting of Planter fasciitis I believe patient will benefit from custom-made orthotics to help control the hindfoot motion support the  arch of the foot and take the stress away from plantar fascial.  Patient agrees with the plan like to proceed with orthotics ?-Patient was casted for orthotics ? ? ?Procedure: Injection Tendon/Ligament ?Location: Bilateral plantar fascia at the glabrous junction; medial approach. ?Skin Prep:  alcohol ?Injectate: 0.5 cc 0.5% marcaine plain, 0.5 cc of 1% Lidocaine, 0.5 cc kenalog 10. ?Disposition: Patient tolerated procedure well. Injection site dressed with a band-aid. ? ?No follow-ups on file. ?

## 2021-10-08 ENCOUNTER — Telehealth: Payer: Self-pay

## 2021-10-08 NOTE — Telephone Encounter (Signed)
Casts sent to [facility] [employer] ?

## 2021-10-12 ENCOUNTER — Encounter: Payer: Self-pay | Admitting: Orthopaedic Surgery

## 2021-10-12 ENCOUNTER — Telehealth: Payer: Self-pay

## 2021-10-12 ENCOUNTER — Ambulatory Visit: Payer: BC Managed Care – PPO | Admitting: Orthopaedic Surgery

## 2021-10-12 ENCOUNTER — Other Ambulatory Visit: Payer: Self-pay

## 2021-10-12 ENCOUNTER — Ambulatory Visit: Payer: Self-pay

## 2021-10-12 ENCOUNTER — Ambulatory Visit (INDEPENDENT_AMBULATORY_CARE_PROVIDER_SITE_OTHER): Payer: BC Managed Care – PPO

## 2021-10-12 DIAGNOSIS — M1712 Unilateral primary osteoarthritis, left knee: Secondary | ICD-10-CM

## 2021-10-12 DIAGNOSIS — M25561 Pain in right knee: Secondary | ICD-10-CM | POA: Diagnosis not present

## 2021-10-12 DIAGNOSIS — M1710 Unilateral primary osteoarthritis, unspecified knee: Secondary | ICD-10-CM

## 2021-10-12 DIAGNOSIS — M25562 Pain in left knee: Secondary | ICD-10-CM

## 2021-10-12 DIAGNOSIS — M1711 Unilateral primary osteoarthritis, right knee: Secondary | ICD-10-CM

## 2021-10-12 NOTE — Telephone Encounter (Signed)
Please precert for bilateral visco injections. This is Dr.Xu's patient. Thanks!  

## 2021-10-12 NOTE — Progress Notes (Signed)
? ?Office Visit Note ?  ?Patient: Julia Ashley           ?Date of Birth: [birth year]           ?MRN: [id number] ?Visit Date: 10/12/2021 ?             ?Requested by: Mathews Robinsons., MD ?[address] ?[place],  [place] [address] ?PCP: Mathews Robinsons., MD ? ? ?Assessment & Plan: ?Visit Diagnoses:  ?1. Primary osteoarthritis of right knee   ?2. Primary osteoarthritis of left knee   ? ? ?Plan: Impression is bilateral knee osteoarthritis worse in the patellofemoral compartment.  Treatment options were reviewed and there is associated pros and cons and based on her options she would like to try viscosupplementation for both knees.  We also talked about the importance of strength and weight loss and how this affects knee pain.  She will follow-up for the Visco injections. ? ?This patient is diagnosed with osteoarthritis of the knee(s).   ? ?Radiographs show evidence of joint space narrowing, osteophytes, subchondral sclerosis and/or subchondral cysts.  This patient has knee pain which interferes with functional and activities of daily living.   ? ?This patient has experienced inadequate response, adverse effects and/or intolerance with conservative treatments such as acetaminophen, NSAIDS, topical creams, physical therapy or regular exercise, knee bracing and/or weight loss.  ? ?This patient has experienced inadequate response or has a contraindication to intra articular steroid injections for at least 3 months.  ? ?This patient is not scheduled to have a total knee replacement within 6 months of starting treatment with viscosupplementation. ? ?Follow-Up Instructions: No follow-ups on file.  ? ?Orders:  ?Orders Placed This Encounter  ?Procedures  ? XR KNEE 3 VIEW RIGHT  ? XR KNEE 3 VIEW LEFT  ? ?No orders of the defined types were placed in this encounter. ? ? ? ? Procedures: ?No procedures performed ? ? ?Clinical Data: ?No additional findings. ? ? ?Subjective: ?Chief Complaint  ?Patient presents with  ?  Left Knee - Pain  ? Right Knee - Pain  ? ? ?HPI ? ?Julia Ashley is a very pleasant 56 year old female who comes in for bilateral knee pain of equal severity.  She has had this problem for years.  She has been seen by orthopedics for several years back when she lived in [place].  She has had numerous cortisone injections which helped for about 3 months or so.  She does not have constant pain but mainly pain when she is transitioning from sit to stand or using stairs.  She has done some home exercises were prescribed by her previous orthopedist.  She also works out at [employer]  She denies any swelling.  She has been to flex agenic's and has not felt any relief from their treatments. ? ?Review of Systems  ?Constitutional: Negative.   ?HENT: Negative.    ?Eyes: Negative.   ?Respiratory: Negative.    ?Cardiovascular: Negative.   ?Endocrine: Negative.   ?Musculoskeletal: Negative.   ?Neurological: Negative.   ?Hematological: Negative.   ?Psychiatric/Behavioral: Negative.    ?All other systems reviewed and are negative. ? ? ?Objective: ?Vital Signs: There were no vitals taken for this visit. ? ?Physical Exam ?Vitals and nursing note reviewed.  ?Constitutional:   ?   Appearance: She is well-developed.  ?HENT:  ?   Head: Normocephalic and atraumatic.  ?Pulmonary:  ?   Effort: Pulmonary effort is normal.  ?Abdominal:  ?   Palpations: Abdomen is soft.  ?Musculoskeletal:  ?  Cervical back: Neck supple.  ?Skin: ?   General: Skin is warm.  ?   Capillary Refill: Capillary refill takes less than 2 seconds.  ?Neurological:  ?   Mental Status: She is alert and oriented to person, place, and time.  ?Psychiatric:     ?   Behavior: Behavior normal.     ?   Thought Content: Thought content normal.     ?   Judgment: Judgment normal.  ? ? ?Ortho Exam ? ?Examination of both knees show no joint effusion.  Mild patellofemoral crepitus throughout arc of motion.  Range of motion is relatively well-preserved.  Collaterals and cruciates are stable.   No joint effusion. ? ?Specialty Comments:  ?No specialty comments available. ? ?Imaging: ?XR KNEE 3 VIEW LEFT ? ?Result Date: 10/12/2021 ?Mild to moderate tricompartmental osteoarthritis slightly worse in the patellofemoral compartment. ? ?XR KNEE 3 VIEW RIGHT ? ?Result Date: 10/12/2021 ?Mild to moderate tricompartmental osteoarthritis slightly worse in the patellofemoral compartment.  ? ? ?PMFS History: ?Patient Active Problem List  ? Diagnosis Date Noted  ? Primary osteoarthritis of right knee 10/12/2021  ? Primary osteoarthritis of left knee 10/12/2021  ? ?Past Medical History:  ?Diagnosis Date  ? Hypertension   ?  ?Family History  ?Problem Relation Age of Onset  ? Breast cancer Mother   ? Breast cancer Cousin   ?  ?Past Surgical History:  ?Procedure Laterality Date  ? BREAST BIOPSY  2009  ? ?Social History  ? ?Occupational History  ? Not on file  ?Tobacco Use  ? Smoking status: Never  ? Smokeless tobacco: Never  ?Vaping Use  ? Vaping Use: Never used  ?Substance and Sexual Activity  ? Alcohol use: Never  ? Drug use: Never  ? Sexual activity: Not on file  ? ? ? ? ? ? ?

## 2021-10-12 NOTE — Telephone Encounter (Signed)
Noted  

## 2021-11-01 ENCOUNTER — Ambulatory Visit: Payer: BC Managed Care – PPO | Admitting: Podiatry

## 2021-11-01 DIAGNOSIS — M722 Plantar fascial fibromatosis: Secondary | ICD-10-CM | POA: Diagnosis not present

## 2021-11-06 ENCOUNTER — Telehealth: Payer: Self-pay | Admitting: Orthopaedic Surgery

## 2021-11-06 NOTE — Telephone Encounter (Signed)
Talked with patient concerning gel injections. Appt. Scheduled ? ?BV pending for SYnviscOne, bilateral knee. ?

## 2021-11-06 NOTE — Progress Notes (Signed)
?  Subjective:  ?Patient ID: Julia Ashley, female    DOB: 1966/04/18,  MRN: [id number] ? ?Chief Complaint  ?Patient presents with  ? Plantar Fasciitis  ? ? ?56 y.o. female presents with the above complaint.  Patient presents for follow-up of bilateral Planter fasciitis.  She states that the injection has not helped.  It feels about the pain.  She denies any other acute complaints.  She would like to discuss next treatment plan.  The right side is slightly worse than left side. ? ? ?Review of Systems: Negative except as noted in the HPI. Denies N/V/F/Ch. ? ?Past Medical History:  ?Diagnosis Date  ? Hypertension   ? ? ?Current Outpatient Medications:  ?  amLODipine (NORVASC) 5 MG tablet, Take 1 tablet by mouth daily., Disp: , Rfl:  ?  folic acid (FOLVITE) 800 MCG tablet, Take 800 mcg by mouth daily., Disp: , Rfl:  ?  hydrOXYzine (ATARAX/VISTARIL) 50 MG tablet, Take 1 tablet (50 mg total) by mouth 3 (three) times daily as needed for itching., Disp: 15 tablet, Rfl: 0 ?  methylPREDNISolone (MEDROL DOSEPAK) 4 MG TBPK tablet, Take Tapered dose as directed (Patient not taking: Reported on 01/08/2021), Disp: 21 tablet, Rfl: 0 ? ?Social History  ? ?Tobacco Use  ?Smoking Status Never  ?Smokeless Tobacco Never  ? ? ?Allergies  ?Allergen Reactions  ? Lisinopril Swelling  ?  Hives, swelling, cough, scratchy throat  ? ?Objective:  ?There were no vitals filed for this visit. ?There is no height or weight on file to calculate BMI. ?Constitutional Well developed. ?Well nourished.  ?Vascular Dorsalis pedis pulses palpable bilaterally. ?Posterior tibial pulses palpable bilaterally. ?Capillary refill normal to all digits.  ?No cyanosis or clubbing noted. ?Pedal hair growth normal.  ?Neurologic Normal speech. ?Oriented to person, place, and time. ?Epicritic sensation to light touch grossly present bilaterally.  ?Dermatologic Nails well groomed and normal in appearance. ?No open wounds. ?No skin lesions.  ?Orthopedic: Normal joint ROM  without pain or crepitus bilaterally. ?No visible deformities. ?Tender to palpation at the calcaneal tuber bilaterally. ?No pain with calcaneal squeeze bilaterally. ?Ankle ROM diminished range of motion bilaterally. ?Silfverskiold Test: positive bilaterally. ? ? ?Gait examination shows pes planovalgus with calcaneovalgus to many toe signs partially recruit the arch with dorsiflexion of the hallux.  ? ?Radiographs: Taken and reviewed. No acute fractures or dislocations. No evidence of stress fracture.  Plantar heel spur present. Posterior heel spur absent.  Pes planovalgus foot structure noted ? ?Assessment:  ? ?No diagnosis found. ? ?Plan:  ?Patient was evaluated and treated and all questions answered. ? ?Plantar Fasciitis, bilaterally right slightly greater than left side ?- XR reviewed as above.  ?- Educated on icing and stretching. Instructions given.  ?-I will hold off on any further injection as they have not helped. ?- DME: Cam boot on the right side. ?- Pharmacologic management: None ?-I briefly discussed surgical options with her.  She would like to discuss surgical cost and think about that. ? ?Pes planovalgus ?-I explained to patient the etiology of pes planovalgus and relationship with Planter fasciitis and various treatment options were discussed.  Given patient foot structure in the setting of Planter fasciitis I believe patient will benefit from custom-made orthotics to help control the hindfoot motion support the arch of the foot and take the stress away from plantar fascial.  Patient agrees with the plan like to proceed with orthotics ?-Patient was casted for orthotics ? ?. ? ?No follow-ups on file. ?

## 2021-11-06 NOTE — Telephone Encounter (Signed)
Pt called requesting status of bilateral injections. Please call pt at [phone]. ?

## 2021-11-13 ENCOUNTER — Ambulatory Visit: Payer: BC Managed Care – PPO | Admitting: Podiatry

## 2021-11-13 ENCOUNTER — Encounter: Payer: Self-pay | Admitting: Podiatry

## 2021-11-13 ENCOUNTER — Telehealth: Payer: Self-pay

## 2021-11-13 DIAGNOSIS — M722 Plantar fascial fibromatosis: Secondary | ICD-10-CM

## 2021-11-13 DIAGNOSIS — M21862 Other specified acquired deformities of left lower leg: Secondary | ICD-10-CM

## 2021-11-13 DIAGNOSIS — Z01818 Encounter for other preprocedural examination: Secondary | ICD-10-CM

## 2021-11-13 DIAGNOSIS — M62462 Contracture of muscle, left lower leg: Secondary | ICD-10-CM

## 2021-11-13 NOTE — Telephone Encounter (Signed)
PA required for SynviscOne, bilateral knee. ?PA submitted online through Covermymeds ?Pending PA# [id number] ?

## 2021-11-14 ENCOUNTER — Ambulatory Visit: Payer: BC Managed Care – PPO | Admitting: Physician Assistant

## 2021-11-14 ENCOUNTER — Telehealth: Payer: Self-pay

## 2021-11-14 ENCOUNTER — Telehealth: Payer: Self-pay | Admitting: *Deleted

## 2021-11-14 ENCOUNTER — Encounter: Payer: Self-pay | Admitting: Physician Assistant

## 2021-11-14 DIAGNOSIS — M170 Bilateral primary osteoarthritis of knee: Secondary | ICD-10-CM | POA: Diagnosis not present

## 2021-11-14 MED ORDER — BUPIVACAINE HCL 0.25 % IJ SOLN
0.6600 mL | INTRAMUSCULAR | Status: AC | PRN
  Administered 2021-11-14: .66 mL via INTRA_ARTICULAR

## 2021-11-14 MED ORDER — LIDOCAINE HCL 1 % IJ SOLN
3.0000 mL | INTRAMUSCULAR | Status: AC | PRN
  Administered 2021-11-14: 3 mL

## 2021-11-14 MED ORDER — HYLAN G-F 20 48 MG/6ML IX SOSY
48.0000 mg | PREFILLED_SYRINGE | INTRA_ARTICULAR | Status: AC | PRN
  Administered 2021-11-14: 48 mg via INTRA_ARTICULAR

## 2021-11-14 NOTE — Telephone Encounter (Signed)
Approved for SynviscOne, bilateral knee. ?Cable ?Covered at 100% after Co-pay ?Co-pay of $40.00 ?PA Approval# [id number] ?Valid 11/13/2021- 05/11/2022 ? ?

## 2021-11-14 NOTE — Progress Notes (Signed)
? ?  Office Visit Note ?  ?Patient: Julia Ashley           ?Date of Birth: 27-Jul-1966           ?MRN: [id number] ?Visit Date: 11/14/2021 ?             ?Requested by: Mathews Robinsons., MD ?[address] ?[place],  [place] [address] ?PCP: Mathews Robinsons., MD ? ? ?Assessment & Plan: ?Visit Diagnoses:  ?1. Bilateral primary osteoarthritis of knee   ? ? ?Plan: Impression is bilateral knee osteoarthritis.  Today, proceed with bilateral knee Synvisc 1 injections.  She tolerated these well.  Follow-up with [place] as needed. ? ?Follow-Up Instructions: Return if symptoms worsen or fail to improve.  ? ?Orders:  ?Orders Placed This Encounter  ?Procedures  ? Large Joint Inj: bilateral knee  ? ?No orders of the defined types were placed in this encounter. ? ? ? ? Procedures: ?Large Joint Inj: bilateral knee on 11/14/2021 3:45 PM ?Indications: pain ?Details: 22 G needle, anterolateral approach ?Medications (Right): 3 mL lidocaine 1 %; 0.66 mL bupivacaine 0.25 %; 48 mg Hylan 48 MG/6ML ?Medications (Left): 0.66 mL bupivacaine 0.25 %; 3 mL lidocaine 1 %; 48 mg Hylan 48 MG/6ML ? ? ? ? ?Clinical Data: ?No additional findings. ? ? ?Subjective: ?Chief Complaint  ?Patient presents with  ? Left Knee - Pain  ? Right Knee - Pain  ? ? ?HPI patient is a pleasant 56 year old female who comes in today with bilateral knee osteoarthritis.  She is here for bilateral knee Synvisc 1 injections.  She has had cortisone injections in the past with only temporary relief.  No previous viscosupplementation injection. ? ? ? ? ?Objective: ?Vital Signs: There were no vitals taken for this visit. ? ? ? ?Ortho Exam stable bilateral knee exams ? ?Specialty Comments:  ?No specialty comments available. ? ?Imaging: ?No new imaging ? ? ?PMFS History: ?Patient Active Problem List  ? Diagnosis Date Noted  ? Primary osteoarthritis of right knee 10/12/2021  ? Primary osteoarthritis of left knee 10/12/2021  ? ?Past Medical History:  ?Diagnosis Date  ? Hypertension    ?  ?Family History  ?Problem Relation Age of Onset  ? Breast cancer Mother   ? Breast cancer Cousin   ?  ?Past Surgical History:  ?Procedure Laterality Date  ? BREAST BIOPSY  2009  ? ?Social History  ? ?Occupational History  ? Not on file  ?Tobacco Use  ? Smoking status: Never  ? Smokeless tobacco: Never  ?Vaping Use  ? Vaping Use: Never used  ?Substance and Sexual Activity  ? Alcohol use: Never  ? Drug use: Never  ? Sexual activity: Not on file  ? ? ? ? ? ? ?

## 2021-11-14 NOTE — Telephone Encounter (Signed)
Patient is scheduled for foot surgery on April 24th and is supposed to have a knee injection today. Is that okay to do with upcoming surgery? Please advise. ?

## 2021-11-14 NOTE — Telephone Encounter (Signed)
DOS 11/26/2021 ? ?EPF LT - 29893 ?GASTROCNEMIUS RECESS LT - 73220 ? ?[employer]BS EFFECTIVE DATE - 08/05/2021 ? ?PLAN DEDUCTIBLE - $500.00 W/ $5.03 REMAINING ?OUT OF POCKET - $1500.00 W/ $819.47 REMAINING ?COPAY $0.00 ?COINSURANCE - 10% PER SERVICE YEAR  ? ?PER WEBSITE NO AUTH REQUIRED ? ?

## 2021-11-20 NOTE — Progress Notes (Signed)
?Subjective:  ?Patient ID: Julia Ashley, female    DOB: 1965-08-22,  MRN: [id number] ? ?Chief Complaint  ?Patient presents with  ? Plantar Fasciitis  ?  Surgical consult  ? ? ?56 y.o. female presents with the above complaint.  Patient presents for follow-up of bilateral Planter fasciitis.  She states nothing is helped her pain is about the same.  The left side is little greater than right side.  She would like to discuss surgical options 1 foot at a time now. ? ? ?Review of Systems: Negative except as noted in the HPI. Denies N/V/F/Ch. ? ?Past Medical History:  ?Diagnosis Date  ? Hypertension   ? ? ?Current Outpatient Medications:  ?  amLODipine (NORVASC) 5 MG tablet, Take 1 tablet by mouth daily., Disp: , Rfl:  ?  folic acid (FOLVITE) 800 MCG tablet, Take 800 mcg by mouth daily., Disp: , Rfl:  ?  hydrOXYzine (ATARAX/VISTARIL) 50 MG tablet, Take 1 tablet (50 mg total) by mouth 3 (three) times daily as needed for itching., Disp: 15 tablet, Rfl: 0 ?  methylPREDNISolone (MEDROL DOSEPAK) 4 MG TBPK tablet, Take Tapered dose as directed (Patient not taking: Reported on 01/08/2021), Disp: 21 tablet, Rfl: 0 ? ?Social History  ? ?Tobacco Use  ?Smoking Status Never  ?Smokeless Tobacco Never  ? ? ?Allergies  ?Allergen Reactions  ? Aspirin Hives and Swelling  ? Lisinopril Swelling  ?  Hives, swelling, cough, scratchy throat  ? ?Objective:  ?There were no vitals filed for this visit. ?There is no height or weight on file to calculate BMI. ?Constitutional Well developed. ?Well nourished.  ?Vascular Dorsalis pedis pulses palpable bilaterally. ?Posterior tibial pulses palpable bilaterally. ?Capillary refill normal to all digits.  ?No cyanosis or clubbing noted. ?Pedal hair growth normal.  ?Neurologic Normal speech. ?Oriented to person, place, and time. ?Epicritic sensation to light touch grossly present bilaterally.  ?Dermatologic Nails well groomed and normal in appearance. ?No open wounds. ?No skin lesions.  ?Orthopedic: Normal  joint ROM without pain or crepitus bilaterally. ?No visible deformities. ?Tender to palpation at the calcaneal tuber bilaterally. ?No pain with calcaneal squeeze bilaterally. ?Ankle ROM diminished range of motion bilaterally. ?Silfverskiold Test: positive bilaterally. ? ? ?Gait examination shows pes planovalgus with calcaneovalgus to many toe signs partially recruit the arch with dorsiflexion of the hallux.  ? ?Radiographs: Taken and reviewed. No acute fractures or dislocations. No evidence of stress fracture.  Plantar heel spur present. Posterior heel spur absent.  Pes planovalgus foot structure noted ? ?Assessment:  ? ?1. Plantar fasciitis of left foot   ?2. Encounter for preoperative examination for general surgical procedure   ?3. Gastrocnemius equinus, left   ? ? ?Plan:  ?Patient was evaluated and treated and all questions answered. ? ?Plantar Fasciitis, bilaterally left greater than right side ?-Patient would like to discuss surgical options at this time she has failed all conservative treatment options.  Nothing is helped including offloading injections shoe gear modification orthotics.  We will start on the left side first.  We will plan on doing endoscopic plantar fasciotomy and gastrocnemius recession.  I discussed my preoperative intra postoperative plan in extensive detail she states understanding would like to proceed with surgery.  She will be weightbearing as tolerated with Cam boot. ?-Informed surgical risk consent was reviewed and read aloud to the patient.  I reviewed the films.  I have discussed my findings with the patient in great detail.  I have discussed all risks including but not limited to infection, stiffness,  scarring, limp, disability, deformity, damage to blood vessels and nerves, numbness, poor healing, need for braces, arthritis, chronic pain, amputation, death.  All benefits and realistic expectations discussed in great detail.  I have made no promises as to the outcome.  I have  provided realistic expectations.  I have offered the patient a 2nd opinion, which they have declined and assured me they preferred to proceed despite the risks.  This will be part of a staged procedure where out to the right side next time. ? ? ?Pes planovalgus ?-I explained to patient the etiology of pes planovalgus and relationship with Planter fasciitis and various treatment options were discussed.  Given patient foot structure in the setting of Planter fasciitis I believe patient will benefit from custom-made orthotics to help control the hindfoot motion support the arch of the foot and take the stress away from plantar fascial.  Patient agrees with the plan like to proceed with orthotics ?-Patient is functioning well and orthotics ? ?. ? ?No follow-ups on file. ?

## 2021-11-22 ENCOUNTER — Encounter: Payer: Self-pay | Admitting: Internal Medicine

## 2021-11-22 ENCOUNTER — Ambulatory Visit: Payer: BC Managed Care – PPO | Admitting: Internal Medicine

## 2021-11-22 VITALS — BP 136/84 | HR 98 | Temp 98.2°F | Resp 16 | Ht 72.0 in | Wt 297.7 lb

## 2021-11-22 DIAGNOSIS — Z13220 Encounter for screening for lipoid disorders: Secondary | ICD-10-CM | POA: Diagnosis not present

## 2021-11-22 DIAGNOSIS — Z1159 Encounter for screening for other viral diseases: Secondary | ICD-10-CM | POA: Diagnosis not present

## 2021-11-22 DIAGNOSIS — R233 Spontaneous ecchymoses: Secondary | ICD-10-CM

## 2021-11-22 DIAGNOSIS — I10 Essential (primary) hypertension: Secondary | ICD-10-CM | POA: Insufficient documentation

## 2021-11-22 DIAGNOSIS — N941 Unspecified dyspareunia: Secondary | ICD-10-CM

## 2021-11-22 DIAGNOSIS — Z114 Encounter for screening for human immunodeficiency virus [HIV]: Secondary | ICD-10-CM

## 2021-11-22 NOTE — Patient Instructions (Addendum)
It was great seeing you today! ? ?Plan discussed at today's visit: ?-Blood work ordered today, results will be uploaded to MyChart.  ?-Continue to monitor blood pressure at home ?-Try lubricate to help with pain during intercourse ? ?Follow up in: 6 months  ? ?Take care and let us know if you have any questions or concerns prior to your next visit. ? ?Dr. Caralee Ates ? ? ?Recombinant Zoster (Shingles) Vaccine: What You Need to Know ?1. Why get vaccinated? ?Recombinant zoster (shingles) vaccine can prevent shingles. ?Shingles (also called herpes zoster, or just zoster) is a painful skin rash, usually with blisters. In addition to the rash, shingles can cause fever, headache, chills, or upset stomach. Rarely, shingles can lead to complications such as pneumonia, hearing problems, blindness, brain inflammation (encephalitis), or death. ?The risk of shingles increases with age. The most common complication of shingles is long-term nerve pain called postherpetic neuralgia (PHN). PHN occurs in the areas where the shingles rash was and can last for months or years after the rash goes away. The pain from PHN can be severe and debilitating. ?The risk of PHN increases with age. An older adult with shingles is more likely to develop PHN and have longer lasting and more severe pain than a younger person. ?People with weakened immune systems also have a higher risk of getting shingles and complications from the disease. ?Shingles is caused by varicella-zoster virus, the same virus that causes chickenpox. After you have chickenpox, the virus stays in your body and can cause shingles later in life. Shingles cannot be passed from one person to another, but the virus that causes shingles can spread and cause chickenpox in someone who has never had chickenpox or has never received chickenpox vaccine. ?2. Recombinant shingles vaccine ?Recombinant shingles vaccine provides strong protection against shingles. By preventing shingles,  recombinant shingles vaccine also protects against PHN and other complications. ?Recombinant shingles vaccine is recommended for: ?Adults 50 years and older ?Adults 19 years and older who have a weakened immune system because of disease or treatments ?Shingles vaccine is given as a two-dose series. For most people, the second dose should be given 2 to 6 months after the first dose. Some people who have or will have a weakened immune system can get the second dose 1 to 2 months after the first dose. Ask your health care provider for guidance. ?People who have had shingles in the past and people who have received varicella (chickenpox) vaccine are recommended to get recombinant shingles vaccine. The vaccine is also recommended for people who have already gotten another type of shingles vaccine, the live shingles vaccine. There is no live virus in recombinant shingles vaccine. ?Shingles vaccine may be given at the same time as other vaccines. ?3. Talk with your health care provider ?Tell your vaccination provider if the person getting the vaccine: ?Has had an allergic reaction after a previous dose of recombinant shingles vaccine, or has any severe, life-threatening allergies ?Is currently experiencing an episode of shingles ?Is pregnant ?In some cases, your health care provider may decide to postpone shingles vaccination until a future visit. ?People with minor illnesses, such as a cold, may be vaccinated. People who are moderately or severely ill should usually wait until they recover before getting recombinant shingles vaccine. ?Your health care provider can give you more information. ?4. Risks of a vaccine reaction ?A sore arm with mild or moderate pain is very common after recombinant shingles vaccine. Redness and swelling can also happen at the  site of the injection. ?Tiredness, muscle pain, headache, shivering, fever, stomach pain, and nausea are common after recombinant shingles vaccine. ?These side effects may  temporarily prevent a vaccinated person from doing regular activities. Symptoms usually go away on their own in 2 to 3 days. You should still get the second dose of recombinant shingles vaccine even if you had one of these reactions after the first dose. ?Guillain-Barr? syndrome (GBS), a serious nervous system disorder, has been reported very rarely after recombinant zoster vaccine. ?People sometimes faint after medical procedures, including vaccination. Tell your provider if you feel dizzy or have vision changes or ringing in the ears. ?As with any medicine, there is a very remote chance of a vaccine causing a severe allergic reaction, other serious injury, or death. ?5. What if there is a serious problem? ?An allergic reaction could occur after the vaccinated person leaves the clinic. If you see signs of a severe allergic reaction (hives, swelling of the face and throat, difficulty breathing, a fast heartbeat, dizziness, or weakness), call 9-1-1 and get the person to the nearest hospital. ?For other signs that concern you, call your health care provider. ?Adverse reactions should be reported to the Vaccine Adverse Event Reporting System (VAERS). Your health care provider will usually file this report, or you can do it yourself. Visit the VAERS website at www.vaers.LAgents.no or call 340-335-4893. VAERS is only for reporting reactions, and VAERS staff members do not give medical advice. ?6. How can I learn more? ?Ask your health care provider. ?Call your local or state health department. ?Visit the website of the Food and Drug Administration (FDA) for vaccine package inserts and additional information at GoldCloset.com.ee. ?Contact the Centers for Disease Control and Prevention (CDC): ?Call 323-667-9260 (1-800-CDC-INFO) or ?Visit CDC's website at PicCapture.uy. ?Source: CDC Vaccine Information Statement Recombinant Zoster Vaccine (09/08/2020) ?This same material is available at  FootballExhibition.com.br for no charge. ?This information is not intended to replace advice given to you by your health care provider. Make sure you discuss any questions you have with your health care provider. ?Document Revised: 06/20/2021 Document Reviewed: 09/22/2020 ?Elsevier Patient Education ? 2023 Elsevier Inc. ?  ?

## 2021-11-22 NOTE — Progress Notes (Signed)
? ?New Patient Office Visit ? ?Subjective   ? ?Patient ID: Julia Ashley, female    DOB: Jul 10, 1966  Age: 56 y.o. MRN: [id number] ? ?CC:  ?Chief Complaint  ?Patient presents with  ? Establish Care  ? Dyspareunia  ?  Has went through menopause  ? bruise  ?  Random and not knowing she is hitting herself no hx of anemia  ? ? ?HPI ?Julia Ashley presents to establish care.  ? ?Hypertension: ?-Medications: HCTZ 12.5 mg (recently added about 2 months), Amlodipine 5 mg ?-Patient is compliant with above medications and reports no side effects. ?-Checking BP at home (average): has a cuff at home but doesn't check regularly  ?-Denies any SOB, CP, vision changes, LE edema or symptoms of hypotension ?-Exercise: has OA of knees and plantar fascitis that she is actually about to have surgery for, so no current exercise regimen, tries to do chair exercises ? ?Dyspareunia:  ?-Started within the last few years  ?-Went throughout menopause about 5-6 years ago and symptoms worsening since that time ?-Pain primarily with initiation of sexual intercourse to the point that she is no longer sexually active ?-No vulvar skin changes that she's noticed or bleeding ? ?Abnormal Bruising:  ?-Bruises easily with light touch, wakes up with multiple bruises on legs primarily but on arms and trunk as well  ?-No issues with abnormal bleeding ?-No family history of clotting or bleeding disorders ? ?Health Maintenance: ?-Blood work due  ?-Mammogram 10/22: Birads-1 ?-Pap UTD, getting records ?-Colonoscopy 12/2015, getting records  ? ?Outpatient Encounter Medications as of 11/22/2021  ?Medication Sig  ? amLODipine (NORVASC) 5 MG tablet Take 1 tablet by mouth daily.  ? hydrochlorothiazide (HYDRODIURIL) 12.5 MG tablet Take 12.5 mg by mouth daily.  ? [DISCONTINUED] folic acid (FOLVITE) 800 MCG tablet Take 800 mcg by mouth daily.  ? [DISCONTINUED] hydrOXYzine (ATARAX/VISTARIL) 50 MG tablet Take 1 tablet (50 mg total) by mouth 3 (three) times daily as needed  for itching.  ? [DISCONTINUED] methylPREDNISolone (MEDROL DOSEPAK) 4 MG TBPK tablet Take Tapered dose as directed (Patient not taking: Reported on 01/08/2021)  ? ?No facility-administered encounter medications on file as of 11/22/2021.  ? ? ?Past Medical History:  ?Diagnosis Date  ? Hypertension   ? ? ?Past Surgical History:  ?Procedure Laterality Date  ? BREAST BIOPSY  2009  ? THYROIDECTOMY, PARTIAL    ? ? ?Family History  ?Problem Relation Age of Onset  ? Breast cancer Mother   ? Hypertension Father   ? Heart attack Father   ? Breast cancer Cousin   ? ? ?Social History  ? ?Socioeconomic History  ? Marital status: Married  ?  Spouse name: Not on file  ? Number of children: Not on file  ? Years of education: Not on file  ? Highest education level: Not on file  ?Occupational History  ? Not on file  ?Tobacco Use  ? Smoking status: Never  ? Smokeless tobacco: Never  ?Vaping Use  ? Vaping Use: Never used  ?Substance and Sexual Activity  ? Alcohol use: Yes  ?  Comment: very rare  ? Drug use: Never  ? Sexual activity: Not Currently  ?Other Topics Concern  ? Not on file  ?Social History Narrative  ? Not on file  ? ?Social Determinants of Health  ? ?Financial Resource Strain: Not on file  ?Food Insecurity: Not on file  ?Transportation Needs: Not on file  ?Physical Activity: Not on file  ?Stress: Not on file  ?Social  Connections: Not on file  ?Intimate Partner Violence: Not on file  ? ? ?Review of Systems  ?Constitutional:  Negative for chills, fever and malaise/fatigue.  ?Eyes:  Negative for blurred vision.  ?Respiratory:  Negative for cough and shortness of breath.   ?Cardiovascular:  Negative for chest pain and palpitations.  ?Gastrointestinal:  Negative for abdominal pain, nausea and vomiting.  ?Neurological:  Negative for dizziness and headaches.  ?Endo/Heme/Allergies:  Bruises/bleeds easily.  ? ?  ? ? ?Objective   ? ?BP 136/84   Pulse 98   Temp 98.2 ?F (36.8 ?C)   Resp 16   Ht 6' (1.829 m)   Wt 297 lb 11.2 oz (135  kg)   SpO2 98%   BMI 40.38 kg/m?  ? ?Physical Exam ?Constitutional:   ?   Appearance: Normal appearance.  ?HENT:  ?   Head: Normocephalic and atraumatic.  ?   Mouth/Throat:  ?   Mouth: Mucous membranes are moist.  ?   Pharynx: Oropharynx is clear.  ?Eyes:  ?   Conjunctiva/sclera: Conjunctivae normal.  ?Cardiovascular:  ?   Rate and Rhythm: Normal rate and regular rhythm.  ?Pulmonary:  ?   Effort: Pulmonary effort is normal.  ?   Breath sounds: Normal breath sounds.  ?Musculoskeletal:  ?   Right lower leg: Edema present.  ?   Left lower leg: Edema present.  ?   Comments: Chronic BLE non-pitting edema  ?Skin: ?   General: Skin is warm and dry.  ?   Findings: Bruising present.  ?Neurological:  ?   General: No focal deficit present.  ?   Mental Status: She is alert. Mental status is at baseline.  ?Psychiatric:     ?   Mood and Affect: Mood normal.     ?   Behavior: Behavior normal.  ? ?  ? ?Assessment & Plan:  ? ?Problem List Items Addressed This Visit   ? ?  ? Cardiovascular and Mediastinum  ? Primary hypertension - Primary  ?  Blood pressure stable today. Continue current regimen of HCTZ 12.5 mg and Amlodipine 5 mg. Encouraged to start checking blood pressure at home.  ? ?  ?  ?   ?   ? Other Relevant Orders  ? CBC w/Diff/Platelet  ? COMPLETE METABOLIC PANEL WITH GFR  ? ?Other Visit Diagnoses   ? ? Abnormal bruising    -etiology uncertain at this time.  We will check an INR/PT to assess for clotting time.  ? Relevant Orders  ? INR/PT  ? Dyspareunia in female    -discussed how this can be normal in postmenopausal females.  Recommend using lubricant to make sexual intercourse more comfortable.  ? Lipid screening    -due for lipid screening.  ? Relevant Orders  ? Lipid Profile  ? Need for hepatitis C screening test    -due for hepatitis C screening  ? Relevant Orders  ? Hepatitis C Antibody  ? Encounter for screening for HIV    -due for HIV screening.  ? Relevant Orders  ? HIV antibody (with reflex)  ? ?  ? ? ?Return  in about 6 months (around 05/24/2022).  ? ?Margarita Mail, DO ? ? ?

## 2021-11-22 NOTE — Assessment & Plan Note (Signed)
Blood pressure stable today. Continue current regimen of HCTZ 12.5 mg and Amlodipine 5 mg. Encouraged to start checking blood pressure at home.  ?

## 2021-11-23 LAB — CBC WITH DIFFERENTIAL/PLATELET
Absolute Monocytes: 211 cells/uL (ref 200–950)
Basophils Absolute: 19 cells/uL (ref 0–200)
Basophils Relative: 0.4 %
Eosinophils Absolute: 120 cells/uL (ref 15–500)
Eosinophils Relative: 2.5 %
HCT: 34.5 % — ABNORMAL LOW (ref 35.0–45.0)
Hemoglobin: 11.1 g/dL — ABNORMAL LOW (ref 11.7–15.5)
Lymphs Abs: 1824 cells/uL (ref 850–3900)
MCH: 28.2 pg (ref 27.0–33.0)
MCHC: 32.2 g/dL (ref 32.0–36.0)
MCV: 87.6 fL (ref 80.0–100.0)
MPV: 10.6 fL (ref 7.5–12.5)
Monocytes Relative: 4.4 %
Neutro Abs: 2626 cells/uL (ref 1500–7800)
Neutrophils Relative %: 54.7 %
Platelets: 317 10*3/uL (ref 140–400)
RBC: 3.94 10*6/uL (ref 3.80–5.10)
RDW: 16.1 % — ABNORMAL HIGH (ref 11.0–15.0)
Total Lymphocyte: 38 %
WBC: 4.8 10*3/uL (ref 3.8–10.8)

## 2021-11-23 LAB — COMPLETE METABOLIC PANEL WITH GFR
AG Ratio: 1.3 (calc) (ref 1.0–2.5)
ALT: 8 U/L (ref 6–29)
AST: 17 U/L (ref 10–35)
Albumin: 3.9 g/dL (ref 3.6–5.1)
Alkaline phosphatase (APISO): 93 U/L (ref 37–153)
BUN/Creatinine Ratio: 11 (calc) (ref 6–22)
BUN: 13 mg/dL (ref 7–25)
CO2: 28 mmol/L (ref 20–32)
Calcium: 9.1 mg/dL (ref 8.6–10.4)
Chloride: 105 mmol/L (ref 98–110)
Creat: 1.16 mg/dL — ABNORMAL HIGH (ref 0.50–1.03)
Globulin: 3.1 g/dL (calc) (ref 1.9–3.7)
Glucose, Bld: 90 mg/dL (ref 65–99)
Potassium: 4.2 mmol/L (ref 3.5–5.3)
Sodium: 141 mmol/L (ref 135–146)
Total Bilirubin: 0.4 mg/dL (ref 0.2–1.2)
Total Protein: 7 g/dL (ref 6.1–8.1)
eGFR: 55 mL/min/{1.73_m2} — ABNORMAL LOW (ref >=60)

## 2021-11-23 LAB — HEPATITIS C ANTIBODY
Hepatitis C Ab: NONREACTIVE
SIGNAL TO CUT-OFF: 0.09 (ref <1.00)

## 2021-11-23 LAB — LIPID PANEL
Cholesterol: 211 mg/dL — ABNORMAL HIGH (ref <200)
HDL: 81 mg/dL (ref >=50)
LDL Cholesterol (Calc): 114 mg/dL (calc) — ABNORMAL HIGH
Non-HDL Cholesterol (Calc): 130 mg/dL (calc) — ABNORMAL HIGH (ref <130)
Total CHOL/HDL Ratio: 2.6 (calc) (ref <5.0)
Triglycerides: 70 mg/dL (ref <150)

## 2021-11-23 LAB — HIV ANTIBODY (ROUTINE TESTING W REFLEX): HIV 1&2 Ab, 4th Generation: NONREACTIVE

## 2021-11-23 LAB — PROTIME-INR
INR: 1
Prothrombin Time: 9.9 s (ref 9.0–11.5)

## 2021-11-26 ENCOUNTER — Encounter: Payer: Self-pay | Admitting: Podiatry

## 2021-11-26 ENCOUNTER — Other Ambulatory Visit: Payer: Self-pay | Admitting: Podiatry

## 2021-11-26 DIAGNOSIS — M722 Plantar fascial fibromatosis: Secondary | ICD-10-CM | POA: Diagnosis not present

## 2021-11-26 DIAGNOSIS — M216X2 Other acquired deformities of left foot: Secondary | ICD-10-CM | POA: Diagnosis not present

## 2021-11-26 DIAGNOSIS — M205X2 Other deformities of toe(s) (acquired), left foot: Secondary | ICD-10-CM | POA: Diagnosis not present

## 2021-11-26 MED ORDER — OXYCODONE-ACETAMINOPHEN 5-325 MG PO TABS: 1.0000 | ORAL_TABLET | ORAL | 0 refills | Status: DC | PRN

## 2021-11-29 ENCOUNTER — Ambulatory Visit: Payer: BC Managed Care – PPO | Admitting: Podiatry

## 2021-12-04 ENCOUNTER — Ambulatory Visit (INDEPENDENT_AMBULATORY_CARE_PROVIDER_SITE_OTHER): Payer: BC Managed Care – PPO | Admitting: Podiatry

## 2021-12-04 DIAGNOSIS — Z9889 Other specified postprocedural states: Secondary | ICD-10-CM

## 2021-12-04 DIAGNOSIS — M722 Plantar fascial fibromatosis: Secondary | ICD-10-CM

## 2021-12-04 DIAGNOSIS — M21862 Other specified acquired deformities of left lower leg: Secondary | ICD-10-CM

## 2021-12-04 MED ORDER — DOXYCYCLINE HYCLATE 100 MG PO TABS: 100.0000 mg | ORAL_TABLET | Freq: Two times a day (BID) | ORAL | 0 refills | Status: AC

## 2021-12-04 NOTE — Progress Notes (Signed)
?  Subjective:  ?Patient ID: Julia Ashley, female    DOB: 06-17-1966,  MRN: [id number] ? ?Chief Complaint  ?Patient presents with  ? Routine Post Op  ?   POV #1 DOS 11/26/2021 LT GASTROCNEMIUS RECESSION W/EPF  ? ? ?DOS: 11/26/2021 ?Procedure: Left gastrocnemius recession with endoscopic plantar fasciotomy ? ?56 y.o. female returns for post-op check.  She states she is doing well.  She is able to tolerate putting weight down with the boot on.  She denies any other acute complaints.  Bandages clean dry and intact. ? ?Review of Systems: Negative except as noted in the HPI. Denies N/V/F/Ch. ? ?Past Medical History:  ?Diagnosis Date  ? Hypertension   ? ? ?Current Outpatient Medications:  ?  doxycycline (VIBRA-TABS) 100 MG tablet, Take 1 tablet (100 mg total) by mouth 2 (two) times daily for 10 days., Disp: 20 tablet, Rfl: 0 ?  amLODipine (NORVASC) 5 MG tablet, Take 1 tablet by mouth daily., Disp: , Rfl:  ?  hydrochlorothiazide (HYDRODIURIL) 12.5 MG tablet, Take 12.5 mg by mouth daily., Disp: , Rfl:  ?  oxyCODONE-acetaminophen (PERCOCET) 5-325 MG tablet, Take 1 tablet by mouth every 4 (four) hours as needed for severe pain., Disp: 30 tablet, Rfl: 0 ? ?Social History  ? ?Tobacco Use  ?Smoking Status Never  ?Smokeless Tobacco Never  ? ? ?Allergies  ?Allergen Reactions  ? Aspirin Hives and Swelling  ? Lisinopril Swelling  ?  Hives, swelling, cough, scratchy throat  ? ?Objective:  ?There were no vitals filed for this visit. ?There is no height or weight on file to calculate BMI. ?Constitutional Well developed. ?Well nourished.  ?Vascular Foot warm and well perfused. ?Capillary refill normal to all digits.   ?Neurologic Normal speech. ?Oriented to person, place, and time. ?Epicritic sensation to light touch grossly present bilaterally.  ?Dermatologic Skin healing well without signs of infection. Skin edges well coapted without signs of infection.  ?Orthopedic: Tenderness to palpation noted about the surgical site.   ? ?Radiographs: None ?Assessment:  ? ?1. Plantar fasciitis of left foot   ?2. Status post foot surgery   ?3. Gastrocnemius equinus, left   ? ?Plan:  ?Patient was evaluated and treated and all questions answered. ? ?S/p foot surgery left ?-Progressing as expected post-operatively. ?-XR: See above ?-WB Status: Weightbearing as tolerated in cam boot ?-Sutures: Intact.  No clinical signs of Deis is noted.  No complication noted. ?-Medications: None ?-Foot redressed. ? ?No follow-ups on file.  ?

## 2021-12-18 ENCOUNTER — Ambulatory Visit (INDEPENDENT_AMBULATORY_CARE_PROVIDER_SITE_OTHER): Payer: BC Managed Care – PPO | Admitting: Podiatry

## 2021-12-18 DIAGNOSIS — Z9889 Other specified postprocedural states: Secondary | ICD-10-CM

## 2021-12-18 NOTE — Progress Notes (Signed)
? ?  Subjective:  ?Patient presents today status post EPF and gastrocnemius recession LLE performed by Dr. Allena Katz. DOS: 11/26/2021.  Patient states that she is doing well.  She says that initially she had a short cam boot which rubbed against the incision site of the posterior leg.  She is now wearing a tall cam boot.  She presents today for further treatment and evaluation ? ?Past Medical History:  ?Diagnosis Date  ? Hypertension   ? ?Past Surgical History:  ?Procedure Laterality Date  ? BREAST BIOPSY  2009  ? THYROIDECTOMY, PARTIAL    ? ?Allergies  ?Allergen Reactions  ? Aspirin Hives and Swelling  ? Lisinopril Swelling  ?  Hives, swelling, cough, scratchy throat  ? ? ? ? ?Objective/Physical Exam ?Neurovascular status intact.  Skin incisions appear to be well coapted with sutures intact to the endoscopic plantar fasciotomy sites.  The incision along the posterior leg has dehisced slightly.  The skin staples no longer were maintaining the incision site. No sign of infectious process noted.  No active bleeding noted.  Please see above noted photo ? ?Assessment: ?1. s/p EPF and gastrocnemius recession LT. DOS: 11/26/2021 ? ? ?Plan of Care:  ?1. Patient was evaluated.  Clinically there is no evidence of infection.  No erythema or warmth around the leg.  No antibiotics were prescribed ?2.  Sutures and staples removed today ?3.  Recommend antibiotic ointment and a compression wrap daily to the incision site on the left leg ?4.  Continue weightbearing in the tall cam boot ?5.  Return to clinic in 2 weeks with Dr. Allena Katz ?Felecia Shelling, DPM ?[facility] ? ?Dr. Felecia Shelling, DPM  ?  ?[address]                                    ?[place], [place] [address]                ?Office [phone]  ?Fax [phone] ? ?

## 2022-01-01 ENCOUNTER — Ambulatory Visit (INDEPENDENT_AMBULATORY_CARE_PROVIDER_SITE_OTHER): Payer: BC Managed Care – PPO | Admitting: Podiatry

## 2022-01-01 DIAGNOSIS — Z9889 Other specified postprocedural states: Secondary | ICD-10-CM

## 2022-01-01 DIAGNOSIS — M21862 Other specified acquired deformities of left lower leg: Secondary | ICD-10-CM

## 2022-01-01 DIAGNOSIS — M722 Plantar fascial fibromatosis: Secondary | ICD-10-CM

## 2022-01-01 NOTE — Progress Notes (Signed)
  Subjective:  Patient ID: Julia Ashley, female    DOB: [birth year],  MRN: [id number]  Chief Complaint  Patient presents with   Routine Post Op    DOS: 11/26/2021 Procedure: Left gastrocnemius recession with endoscopic plantar fasciotomy  56 y.o. female returns for post-op check.  She states she is doing well.  She is able to tolerate putting weight down with the boot on.  She denies any other acute complaints.  Bandages clean dry and intact.  Review of Systems: Negative except as noted in the HPI. Denies N/V/F/Ch.  Past Medical History:  Diagnosis Date   Hypertension     Current Outpatient Medications:    amLODipine (NORVASC) 5 MG tablet, Take 1 tablet by mouth daily., Disp: , Rfl:    hydrochlorothiazide (HYDRODIURIL) 12.5 MG tablet, Take 12.5 mg by mouth daily., Disp: , Rfl:    oxyCODONE-acetaminophen (PERCOCET) 5-325 MG tablet, Take 1 tablet by mouth every 4 (four) hours as needed for severe pain., Disp: 30 tablet, Rfl: 0  Social History   Tobacco Use  Smoking Status Never  Smokeless Tobacco Never    Allergies  Allergen Reactions   Aspirin Hives and Swelling   Lisinopril Swelling    Hives, swelling, cough, scratchy throat   Objective:  There were no vitals filed for this visit. There is no height or weight on file to calculate BMI. Constitutional Well developed. Well nourished.  Vascular Foot warm and well perfused. Capillary refill normal to all digits.   Neurologic Normal speech. Oriented to person, place, and time. Epicritic sensation to light touch grossly present bilaterally.  Dermatologic Skin completely reepithelialized.  No signs of Deis is noted no infection noted.  Good correction alignment noted.  No pain at the incision sites  Orthopedic: Very mild tenderness to palpation noted about the surgical site.   Radiographs: None Assessment:   1. Status post left foot surgery   2. Plantar fasciitis of left foot   3. Gastrocnemius equinus, left      Plan:  Patient was evaluated and treated and all questions answered.  S/p foot surgery left -Progressing as expected post-operatively. -XR: See above -WB Status: Weightbearing as tolerated in regular shoes -Sutures: None -Medications: None -I discussed physical therapy with the patient.  For now patient would like to do home physical therapy and will hold off on going to a physical place.  For now we will go ahead and hold off and if there is no improvement we will discuss physical therapy in the near future  No follow-ups on file.

## 2022-01-08 ENCOUNTER — Ambulatory Visit (INDEPENDENT_AMBULATORY_CARE_PROVIDER_SITE_OTHER): Payer: BC Managed Care – PPO | Admitting: Orthopaedic Surgery

## 2022-01-08 DIAGNOSIS — M170 Bilateral primary osteoarthritis of knee: Secondary | ICD-10-CM

## 2022-01-08 DIAGNOSIS — M1712 Unilateral primary osteoarthritis, left knee: Secondary | ICD-10-CM

## 2022-01-08 DIAGNOSIS — M1711 Unilateral primary osteoarthritis, right knee: Secondary | ICD-10-CM

## 2022-01-08 MED ORDER — METHYLPREDNISOLONE ACETATE 40 MG/ML IJ SUSP
13.3300 mg | INTRAMUSCULAR | Status: AC | PRN
  Administered 2022-01-08: 13.33 mg via INTRA_ARTICULAR

## 2022-01-08 MED ORDER — LIDOCAINE HCL 1 % IJ SOLN
3.0000 mL | INTRAMUSCULAR | Status: AC | PRN
  Administered 2022-01-08: 3 mL

## 2022-01-08 MED ORDER — BUPIVACAINE HCL 0.25 % IJ SOLN
0.6600 mL | INTRAMUSCULAR | Status: AC | PRN
  Administered 2022-01-08: .66 mL via INTRA_ARTICULAR

## 2022-01-08 NOTE — Progress Notes (Signed)
   Office Visit Note   Patient: Julia Ashley           Date of Birth: September 08, 1965           MRN: [id number] Visit Date: 01/08/2022              Requested by: Margarita Mail, DO [address] [place],  [place] [address] PCP: Margarita Mail, DO   Assessment & Plan: Visit Diagnoses:  1. Bilateral primary osteoarthritis of knee     Plan: Impression is bilateral knee osteoarthritis worse in the patellofemoral compartment.  Today, we proceed with bilateral knee cortisone injections.  She tolerated these well.  She will follow-up with [place] as needed.  Follow-Up Instructions: Return if symptoms worsen or fail to improve.   Orders:  Orders Placed This Encounter  Procedures   Large Joint Inj: bilateral knee   No orders of the defined types were placed in this encounter.     Procedures: Large Joint Inj: bilateral knee on 01/08/2022 3:39 PM Indications: pain Details: 22 G needle, anterolateral approach Medications (Right): 0.66 mL bupivacaine 0.25 %; 3 mL lidocaine 1 %; 13.33 mg methylPREDNISolone acetate 40 MG/ML Medications (Left): 0.66 mL bupivacaine 0.25 %; 3 mL lidocaine 1 %; 13.33 mg methylPREDNISolone acetate 40 MG/ML     Clinical Data: No additional findings.   Subjective: Chief Complaint  Patient presents with   Right Knee - Pain   Left Knee - Pain    HPI patient is a pleasant 56 year old female with underlying bilateral knee osteoarthritis who comes in today with recurrent bilateral knee pain.  She was seen in our office nearly 2 months ago where she underwent viscosupplementation injection of both knees.  She has not noticed any relief from the injections.  She is asking if she can have repeat cortisone injections today as she has had these every 3 months in the past prior orthopedic office out of town.  Review of Systems as detailed in HPI.  All others reviewed and are negative.   Objective: Vital Signs: There were no vitals taken for this  visit.  Physical Exam well-developed well-nourished female in no acute distress but alert and oriented x3.  Ortho Exam stable bilateral knee exam  Specialty Comments:  No specialty comments available.  Imaging: No new imaging   PMFS History: Patient Active Problem List   Diagnosis Date Noted   Primary hypertension 11/22/2021   Primary osteoarthritis of right knee 10/12/2021   Primary osteoarthritis of left knee 10/12/2021   Past Medical History:  Diagnosis Date   Hypertension     Family History  Problem Relation Age of Onset   Breast cancer Mother    Hypertension Father    Heart attack Father    Breast cancer Cousin     Past Surgical History:  Procedure Laterality Date   BREAST BIOPSY  2009   THYROIDECTOMY, PARTIAL     Social History   Occupational History   Not on file  Tobacco Use   Smoking status: Never   Smokeless tobacco: Never  Vaping Use   Vaping Use: Never used  Substance and Sexual Activity   Alcohol use: Yes    Comment: very rare   Drug use: Never   Sexual activity: Not Currently

## 2022-01-11 ENCOUNTER — Ambulatory Visit: Payer: BC Managed Care – PPO | Admitting: Orthopaedic Surgery

## 2022-01-11 ENCOUNTER — Encounter: Payer: Self-pay | Admitting: Podiatry

## 2022-01-29 ENCOUNTER — Ambulatory Visit (INDEPENDENT_AMBULATORY_CARE_PROVIDER_SITE_OTHER): Payer: BC Managed Care – PPO | Admitting: Podiatry

## 2022-01-29 DIAGNOSIS — Z9889 Other specified postprocedural states: Secondary | ICD-10-CM

## 2022-01-29 DIAGNOSIS — M722 Plantar fascial fibromatosis: Secondary | ICD-10-CM

## 2022-01-29 DIAGNOSIS — M21862 Other specified acquired deformities of left lower leg: Secondary | ICD-10-CM

## 2022-02-11 ENCOUNTER — Telehealth: Payer: BC Managed Care – PPO | Admitting: Physician Assistant

## 2022-02-11 DIAGNOSIS — R21 Rash and other nonspecific skin eruption: Secondary | ICD-10-CM | POA: Diagnosis not present

## 2022-02-11 MED ORDER — PREDNISONE 10 MG PO TABS: ORAL_TABLET | ORAL | 0 refills | Status: DC

## 2022-02-11 NOTE — Progress Notes (Signed)
E Visit for Rash  We are sorry that you are not feeling well. Here is how we plan to help!  Based on what you shared with me it looks like you have contact dermatitis.  Contact dermatitis is a skin rash caused by something that touches the skin and causes irritation or inflammation.  Your skin may be red, swollen, dry, cracked, and itch.  The rash should go away in a few days but can last a few weeks.  If you get a rash, it's important to figure out what caused it so the irritant can be avoided in the future. and I am prescribing a two week course of steroids (37 tablets of 10 mg prednisone).  Days 1-4 take 4 tablets (40 mg) daily  Days 5-8 take 3 tablets (30 mg) daily, Days 9-11 take 2 tablets (20 mg) daily, Days 12-14 take 1 tablet (10 mg) daily.    HOME CARE:  Take cool showers and avoid direct sunlight. Apply cool compress or wet dressings. Take a bath in an oatmeal bath.  Sprinkle content of one Aveeno packet under running faucet with comfortably warm water.  Bathe for 15-20 minutes, 1-2 times daily.  Pat dry with a towel. Do not rub the rash. Use hydrocortisone cream. Take an antihistamine like Benadryl for widespread rashes that itch.  The adult dose of Benadryl is 25-50 mg by mouth 4 times daily. Caution:  This type of medication may cause sleepiness.  Do not drink alcohol, drive, or operate dangerous machinery while taking antihistamines.  Do not take these medications if you have prostate enlargement.  Read package instructions thoroughly on all medications that you take.  GET HELP RIGHT AWAY IF:  Symptoms don't go away after treatment. Severe itching that persists. If you rash spreads or swells. If you rash begins to smell. If it blisters and opens or develops a yellow-brown crust. You develop a fever. You have a sore throat. You become short of breath.  MAKE SURE YOU:  Understand these instructions. Will watch your condition. Will get help right away if you are not doing  well or get worse.  Thank you for choosing an e-visit.  Your e-visit answers were reviewed by a board certified advanced clinical practitioner to complete your personal care plan. Depending upon the condition, your plan could have included both over the counter or prescription medications.  Please review your pharmacy choice. Make sure the pharmacy is open so you can pick up prescription now. If there is a problem, you may contact your provider through MyChart messaging and have the prescription routed to another pharmacy.  Your safety is important to us. If you have drug allergies check your prescription carefully.   For the next 24 hours you can use MyChart to ask questions about today's visit, request a non-urgent call back, or ask for a work or school excuse. You will get an email in the next two days asking about your experience. I hope that your e-visit has been valuable and will speed your recovery.  I provided 5 minutes of non face-to-face time during this encounter for chart review and documentation.   

## 2022-03-29 ENCOUNTER — Encounter: Payer: Self-pay | Admitting: Orthopaedic Surgery

## 2022-03-29 ENCOUNTER — Ambulatory Visit (INDEPENDENT_AMBULATORY_CARE_PROVIDER_SITE_OTHER): Payer: BC Managed Care – PPO

## 2022-03-29 ENCOUNTER — Ambulatory Visit (INDEPENDENT_AMBULATORY_CARE_PROVIDER_SITE_OTHER): Payer: BC Managed Care – PPO | Admitting: Orthopaedic Surgery

## 2022-03-29 DIAGNOSIS — M25561 Pain in right knee: Secondary | ICD-10-CM | POA: Diagnosis not present

## 2022-03-29 DIAGNOSIS — M25571 Pain in right ankle and joints of right foot: Secondary | ICD-10-CM

## 2022-03-29 NOTE — Progress Notes (Signed)
Office Visit Note   Patient: Julia Ashley           Date of Birth: Jul 04, 1966           MRN: [id number] Visit Date: 03/29/2022              Requested by: Margarita Mail, DO [address] [place],  [place] [address] PCP: Margarita Mail, DO   Assessment & Plan: Visit Diagnoses:  1. Pain in right ankle and joints of right foot   2. Acute pain of right knee     Plan: Impression is right knee pain concerning for medial meniscal tear versus occult fracture.  We will obtain MRI to assess.  For the right ankle impression is ankle sprain.  We will place her in a ASO brace.  She will follow-up with [place] in a couple weeks to review the MRI.  Follow-Up Instructions: No follow-ups on file.   Orders:  Orders Placed This Encounter  Procedures   XR KNEE 3 VIEW RIGHT   XR Ankle Complete Right   MR Knee Right w/o contrast   No orders of the defined types were placed in this encounter.     Procedures: No procedures performed   Clinical Data: No additional findings.   Subjective: Chief Complaint  Patient presents with   Right Ankle - Pain   Right Knee - Pain    HPI Julia Ashley is a 56 year old female comes in for evaluation of right knee and right ankle pain.  She missed stepped off a curb and rolled her ankle 3 weeks ago.  Has had lateral ankle pain and swelling.  Also has had pain in her knee preventing her from fully extending or flexing.  Feels pain along the medial side of the knee.  Denies any numbness and tingling.  Review of Systems  Constitutional: Negative.   HENT: Negative.    Eyes: Negative.   Respiratory: Negative.    Cardiovascular: Negative.   Endocrine: Negative.   Musculoskeletal: Negative.   Neurological: Negative.   Hematological: Negative.   Psychiatric/Behavioral: Negative.    All other systems reviewed and are negative.    Objective: Vital Signs: There were no vitals taken for this visit.  Physical Exam Vitals and nursing note  reviewed.  Constitutional:      Appearance: She is well-developed.  HENT:     Head: Atraumatic.     Nose: Nose normal.  Eyes:     Extraocular Movements: Extraocular movements intact.  Cardiovascular:     Pulses: Normal pulses.  Pulmonary:     Effort: Pulmonary effort is normal.  Abdominal:     Palpations: Abdomen is soft.  Musculoskeletal:     Cervical back: Neck supple.  Skin:    General: Skin is warm.     Capillary Refill: Capillary refill takes less than 2 seconds.  Neurological:     Mental Status: She is alert. Mental status is at baseline.  Psychiatric:        Behavior: Behavior normal.        Thought Content: Thought content normal.        Judgment: Judgment normal.     Ortho Exam Examination of the right knee shows medial joint line tenderness.  Trace effusion.  Range of motion is for the most part preserved.  Collaterals and cruciates are stable.  Examination of right ankle shows mild tenderness over the lateral ankle ligaments.  Negative drawer test.  Neurovascular intact. Specialty Comments:  No specialty comments available.  Imaging: XR KNEE 3 VIEW RIGHT  Result Date: 03/29/2022 No acute or structural abnormalities  XR Ankle Complete Right  Result Date: 03/29/2022 Negative for acute findings.    PMFS History: Patient Active Problem List   Diagnosis Date Noted   Primary hypertension 11/22/2021   Primary osteoarthritis of right knee 10/12/2021   Primary osteoarthritis of left knee 10/12/2021   Past Medical History:  Diagnosis Date   Hypertension     Family History  Problem Relation Age of Onset   Breast cancer Mother    Hypertension Father    Heart attack Father    Breast cancer Cousin     Past Surgical History:  Procedure Laterality Date   BREAST BIOPSY  2009   THYROIDECTOMY, PARTIAL     Social History   Occupational History   Not on file  Tobacco Use   Smoking status: Never   Smokeless tobacco: Never  Vaping Use   Vaping Use:  Never used  Substance and Sexual Activity   Alcohol use: Yes    Comment: very rare   Drug use: Never   Sexual activity: Not Currently

## 2022-04-14 ENCOUNTER — Ambulatory Visit
Admission: RE | Admit: 2022-04-14 | Discharge: 2022-04-14 | Disposition: A | Discharge status: Home / Self Care | Payer: BC Managed Care – PPO | Source: Ambulatory Visit | Attending: Orthopaedic Surgery | Admitting: Orthopaedic Surgery

## 2022-04-14 DIAGNOSIS — M25461 Effusion, right knee: Secondary | ICD-10-CM | POA: Diagnosis not present

## 2022-04-14 DIAGNOSIS — R600 Localized edema: Secondary | ICD-10-CM | POA: Diagnosis not present

## 2022-04-14 DIAGNOSIS — M25561 Pain in right knee: Secondary | ICD-10-CM

## 2022-04-14 DIAGNOSIS — M1711 Unilateral primary osteoarthritis, right knee: Secondary | ICD-10-CM | POA: Diagnosis not present

## 2022-04-14 DIAGNOSIS — M2241 Chondromalacia patellae, right knee: Secondary | ICD-10-CM | POA: Diagnosis not present

## 2022-04-18 ENCOUNTER — Ambulatory Visit: Payer: BC Managed Care – PPO | Admitting: Orthopaedic Surgery

## 2022-04-18 ENCOUNTER — Encounter: Payer: Self-pay | Admitting: Orthopaedic Surgery

## 2022-04-18 ENCOUNTER — Other Ambulatory Visit: Payer: Self-pay | Admitting: Internal Medicine

## 2022-04-18 DIAGNOSIS — M25571 Pain in right ankle and joints of right foot: Secondary | ICD-10-CM

## 2022-04-18 DIAGNOSIS — M170 Bilateral primary osteoarthritis of knee: Secondary | ICD-10-CM

## 2022-04-18 DIAGNOSIS — M1712 Unilateral primary osteoarthritis, left knee: Secondary | ICD-10-CM

## 2022-04-18 DIAGNOSIS — Z1231 Encounter for screening mammogram for malignant neoplasm of breast: Secondary | ICD-10-CM

## 2022-04-18 DIAGNOSIS — M1711 Unilateral primary osteoarthritis, right knee: Secondary | ICD-10-CM | POA: Diagnosis not present

## 2022-04-18 MED ORDER — METHYLPREDNISOLONE ACETATE 40 MG/ML IJ SUSP
40.0000 mg | INTRAMUSCULAR | Status: AC | PRN
  Administered 2022-04-18: 40 mg via INTRA_ARTICULAR

## 2022-04-18 MED ORDER — LIDOCAINE HCL 1 % IJ SOLN
2.0000 mL | INTRAMUSCULAR | Status: AC | PRN
  Administered 2022-04-18: 2 mL

## 2022-04-18 MED ORDER — BUPIVACAINE HCL 0.5 % IJ SOLN
2.0000 mL | INTRAMUSCULAR | Status: AC | PRN
  Administered 2022-04-18: 2 mL via INTRA_ARTICULAR

## 2022-04-18 NOTE — Progress Notes (Signed)
Office Visit Note   Patient: Julia Ashley           Date of Birth: 1965/10/14           MRN: [id number] Visit Date: 04/18/2022              Requested by: Margarita Mail, DO [address] [place],  [place] [address] PCP: Margarita Mail, DO   Assessment & Plan: Visit Diagnoses:  1. Primary osteoarthritis of left knee   2. Primary osteoarthritis of right knee   3. Pain in right ankle and joints of right foot     Plan: MRI of the right knee shows predominantly chondromalacia and osteoarthritis.  No meniscal pathology just some internal degeneration of the lateral meniscus.  Based on these findings treatment options were discussed and she elected to undergo cortisone injections today.  She has had Visco injections in the past without significant relief.  Follow-Up Instructions: No follow-ups on file.   Orders:  No orders of the defined types were placed in this encounter.  No orders of the defined types were placed in this encounter.     Procedures: Large Joint Inj: bilateral knee on 04/18/2022 8:23 AM Indications: pain Details: 22 G needle  Arthrogram: No  Medications (Right): 2 mL lidocaine 1 %; 2 mL bupivacaine 0.5 %; 40 mg methylPREDNISolone acetate 40 MG/ML Medications (Left): 2 mL lidocaine 1 %; 2 mL bupivacaine 0.5 %; 40 mg methylPREDNISolone acetate 40 MG/ML Outcome: tolerated well, no immediate complications Patient was prepped and draped in the usual sterile fashion.       Clinical Data: No additional findings.   Subjective: Chief Complaint  Patient presents with   Right Ankle - Follow-up   Right Knee - Follow-up    MRI review    HPI Julia Ashley returns today for follow-up of bilateral knee pain and right ankle pain.  Feels little bit better in the ankle just a little lateral burning.  Had right knee MRI scan recently which showed mainly chondromalacia without any structural abnormalities. Review of Systems  Constitutional: Negative.    HENT: Negative.    Eyes: Negative.   Respiratory: Negative.    Cardiovascular: Negative.   Endocrine: Negative.   Musculoskeletal: Negative.   Neurological: Negative.   Hematological: Negative.   Psychiatric/Behavioral: Negative.    All other systems reviewed and are negative.    Objective: Vital Signs: There were no vitals taken for this visit.  Physical Exam Vitals and nursing note reviewed.  Constitutional:      Appearance: She is well-developed.  HENT:     Head: Normocephalic and atraumatic.  Pulmonary:     Effort: Pulmonary effort is normal.  Abdominal:     Palpations: Abdomen is soft.  Musculoskeletal:     Cervical back: Neck supple.  Skin:    General: Skin is warm.     Capillary Refill: Capillary refill takes less than 2 seconds.  Neurological:     Mental Status: She is alert and oriented to person, place, and time.  Psychiatric:        Behavior: Behavior normal.        Thought Content: Thought content normal.        Judgment: Judgment normal.     Ortho Exam Examination of the bilateral knees and ankles are unchanged. Specialty Comments:  No specialty comments available.  Imaging: No results found.   PMFS History: Patient Active Problem List   Diagnosis Date Noted   Primary hypertension 11/22/2021  Primary osteoarthritis of right knee 10/12/2021   Primary osteoarthritis of left knee 10/12/2021   Past Medical History:  Diagnosis Date   Hypertension     Family History  Problem Relation Age of Onset   Breast cancer Mother    Hypertension Father    Heart attack Father    Breast cancer Cousin     Past Surgical History:  Procedure Laterality Date   BREAST BIOPSY  2009   THYROIDECTOMY, PARTIAL     Social History   Occupational History   Not on file  Tobacco Use   Smoking status: Never   Smokeless tobacco: Never  Vaping Use   Vaping Use: Never used  Substance and Sexual Activity   Alcohol use: Yes    Comment: very rare   Drug use:  Never   Sexual activity: Not Currently

## 2022-05-24 ENCOUNTER — Ambulatory Visit (INDEPENDENT_AMBULATORY_CARE_PROVIDER_SITE_OTHER): Payer: BC Managed Care – PPO | Admitting: Internal Medicine

## 2022-05-24 ENCOUNTER — Encounter: Payer: Self-pay | Admitting: Internal Medicine

## 2022-05-24 VITALS — BP 138/88 | HR 103 | Temp 98.1°F | Resp 16 | Ht 72.0 in | Wt 290.5 lb

## 2022-05-24 DIAGNOSIS — Z23 Encounter for immunization: Secondary | ICD-10-CM | POA: Diagnosis not present

## 2022-05-24 DIAGNOSIS — I10 Essential (primary) hypertension: Secondary | ICD-10-CM

## 2022-05-24 DIAGNOSIS — N941 Unspecified dyspareunia: Secondary | ICD-10-CM

## 2022-05-24 DIAGNOSIS — D649 Anemia, unspecified: Secondary | ICD-10-CM

## 2022-05-24 MED ORDER — HYDROCHLOROTHIAZIDE 12.5 MG PO TABS: 12.5000 mg | ORAL_TABLET | Freq: Every day | ORAL | 0 refills | Status: DC

## 2022-05-24 NOTE — Patient Instructions (Signed)
It was great seeing you today!  Plan discussed at today's visit: -Continue same blood pressure medications -Plan for repeat blood work next time -Referral to Gynecology placed today  Follow up in: 3 months   Take care and let us know if you have any questions or concerns prior to your next visit.  Dr. Rosana Berger

## 2022-05-24 NOTE — Progress Notes (Signed)
New Patient Office Visit  Subjective    Patient ID: Julia Ashley, female    DOB: August 14, 1965  Age: 56 y.o. MRN: [id number]  CC:  Chief Complaint  Patient presents with   Follow-up   Hypertension    HPI [name] presents to establish care.   Hypertension: -Medications: HCTZ 12.5 mg, Amlodipine 5 mg BID -Failed Meds: Lisinopril hives and swelling  -Patient is compliant with above medications and reports no side effects. -Checking BP at home (average):130-143/89 -Denies any SOB, CP, vision changes, LE edema or symptoms of hypotension -Exercise:water aerobics, which she is enjoying   HLD: -Medications: Nothing -Last lipid panel: Lipid Panel     Component Value Date/Time   CHOL 211 (H) 11/22/2021 1416   TRIG 70 11/22/2021 1416   HDL 81 11/22/2021 1416   CHOLHDL 2.6 11/22/2021 1416   LDLCALC 114 (H) 11/22/2021 1416   The 10-year ASCVD risk score (Arnett DK, et al., 2019) is: 5%   Values used to calculate the score:     Age: 55 years     Sex: Female     Is Non-Hispanic African American: Yes     Diabetic: No     Tobacco smoker: No     Systolic Blood Pressure: 299 mmHg     Is BP treated: Yes     HDL Cholesterol: 81 mg/dL     Total Cholesterol: 211 mg/dL  Dyspareunia:  -Started within the last few years  -Went throughout menopause about 5-6 years ago and symptoms worsening since that time -Pain primarily with initiation of sexual intercourse to the point that she is no longer sexually active -No vulvar skin changes that she's noticed or bleeding -Has tried water based lubricants without success  Health Maintenance: -Blood work UTD - did have labs done at work in September for general screening, hgb 10.7 with elevated LDH, alk phos elevated as well but other LFT's normal  -Mammogram 10/22: Birads-1; scheduled for 11/1 -Pap UTD, getting records -Colonoscopy 12/2015, getting records   Outpatient Encounter Medications as of 05/24/2022  Medication Sig    amLODipine (NORVASC) 5 MG tablet Take 1 tablet by mouth daily.   hydrochlorothiazide (HYDRODIURIL) 12.5 MG tablet Take 12.5 mg by mouth daily.   [DISCONTINUED] oxyCODONE-acetaminophen (PERCOCET) 5-325 MG tablet Take 1 tablet by mouth every 4 (four) hours as needed for severe pain.   [DISCONTINUED] predniSONE (DELTASONE) 10 MG tablet Days 1-4 take 4 tablets (40 mg) daily  Days 5-8 take 3 tablets (30 mg) daily, Days 9-11 take 2 tablets (20 mg) daily, Days 12-14 take 1 tablet (10 mg) daily.   No facility-administered encounter medications on file as of 05/24/2022.    Past Medical History:  Diagnosis Date   Hypertension     Past Surgical History:  Procedure Laterality Date   BREAST BIOPSY  2009   THYROIDECTOMY, PARTIAL      Family History  Problem Relation Age of Onset   Breast cancer Mother    Hypertension Father    Heart attack Father    Breast cancer Cousin     Social History   Socioeconomic History   Marital status: Married    Spouse name: Not on file   Number of children: Not on file   Years of education: Not on file   Highest education level: Not on file  Occupational History   Not on file  Tobacco Use   Smoking status: Never   Smokeless tobacco: Never  Vaping Use   Vaping Use:  Never used  Substance and Sexual Activity   Alcohol use: Yes    Comment: very rare   Drug use: Never   Sexual activity: Not Currently  Other Topics Concern   Not on file  Social History Narrative   Not on file   Social Determinants of Health   Financial Resource Strain: Not on file  Food Insecurity: Not on file  Transportation Needs: Not on file  Physical Activity: Not on file  Stress: Not on file  Social Connections: Not on file  Intimate Partner Violence: Not on file    Review of Systems  Constitutional:  Negative for chills, fever and malaise/fatigue.  Eyes:  Negative for blurred vision.  Respiratory:  Negative for cough and shortness of breath.   Cardiovascular:   Negative for chest pain and palpitations.  Gastrointestinal:  Negative for abdominal pain, nausea and vomiting.  Neurological:  Negative for dizziness and headaches.        Objective    BP 138/88   Pulse (!) 103   Temp 98.1 F (36.7 C)   Resp 16   Ht 6' (1.829 m)   Wt 290 lb 8 oz (131.8 kg)   SpO2 99%   BMI 39.40 kg/m   Physical Exam Constitutional:      Appearance: Normal appearance.  HENT:     Head: Normocephalic and atraumatic.  Eyes:     Conjunctiva/sclera: Conjunctivae normal.  Cardiovascular:     Rate and Rhythm: Normal rate and regular rhythm.  Pulmonary:     Effort: Pulmonary effort is normal.     Breath sounds: Normal breath sounds.  Skin:    General: Skin is warm and dry.  Neurological:     General: No focal deficit present.     Mental Status: She is alert. Mental status is at baseline.  Psychiatric:        Mood and Affect: Mood normal.        Behavior: Behavior normal.       Assessment & Plan:   1. Primary hypertension: Blood pressure borderline today. Continue HCTZ 12.5 mg and Amlodipine 5 mg BID. Refill HCTZ today. Continue to monitor at home. Follow up in 3 months.   - hydrochlorothiazide (HYDRODIURIL) 12.5 MG tablet; Take 1 tablet (12.5 mg total) by mouth daily.  Dispense: 90 tablet; Refill: 0  2. Dyspareunia in female: Discussed starting vaginal estrogen cream to help with symptoms, patient ultimately opting to discuss with Gynecology, referral placed.  - Ambulatory referral to Gynecology  3. Normocytic anemia: From screening labs at work obtained last month. Hemoglobin 10.7, MCV normal, LDH elevated. Plan to recheck labs in 3 months.   4. Need for influenza vaccination: Flu vaccine administered today.  - Flu Vaccine QUAD 6+ mos PF IM (Fluarix Quad PF)   Return in about 3 months (around 08/24/2022).   Teodora Medici, DO

## 2022-06-05 ENCOUNTER — Ambulatory Visit
Admission: RE | Admit: 2022-06-05 | Discharge: 2022-06-05 | Disposition: A | Discharge status: Home / Self Care | Payer: BC Managed Care – PPO | Source: Ambulatory Visit | Attending: Internal Medicine | Admitting: Internal Medicine

## 2022-06-05 DIAGNOSIS — Z1231 Encounter for screening mammogram for malignant neoplasm of breast: Secondary | ICD-10-CM | POA: Insufficient documentation

## 2022-06-06 ENCOUNTER — Other Ambulatory Visit: Payer: Self-pay | Admitting: Internal Medicine

## 2022-06-06 DIAGNOSIS — R928 Other abnormal and inconclusive findings on diagnostic imaging of breast: Secondary | ICD-10-CM

## 2022-06-06 DIAGNOSIS — R921 Mammographic calcification found on diagnostic imaging of breast: Secondary | ICD-10-CM

## 2022-06-06 DIAGNOSIS — N6489 Other specified disorders of breast: Secondary | ICD-10-CM

## 2022-06-13 ENCOUNTER — Ambulatory Visit
Admission: RE | Admit: 2022-06-13 | Discharge: 2022-06-13 | Disposition: A | Discharge status: Home / Self Care | Payer: BC Managed Care – PPO | Source: Ambulatory Visit | Attending: Internal Medicine | Admitting: Internal Medicine

## 2022-06-13 DIAGNOSIS — R928 Other abnormal and inconclusive findings on diagnostic imaging of breast: Secondary | ICD-10-CM | POA: Diagnosis not present

## 2022-06-13 DIAGNOSIS — R921 Mammographic calcification found on diagnostic imaging of breast: Secondary | ICD-10-CM

## 2022-06-13 DIAGNOSIS — R92323 Mammographic fibroglandular density, bilateral breasts: Secondary | ICD-10-CM | POA: Diagnosis not present

## 2022-06-13 DIAGNOSIS — N6489 Other specified disorders of breast: Secondary | ICD-10-CM | POA: Insufficient documentation

## 2022-06-18 ENCOUNTER — Encounter: Payer: Self-pay | Admitting: Obstetrics & Gynecology

## 2022-06-18 ENCOUNTER — Other Ambulatory Visit (HOSPITAL_COMMUNITY)
Admission: RE | Admit: 2022-06-18 | Discharge: 2022-06-18 | Disposition: A | Discharge status: Home / Self Care | Payer: BC Managed Care – PPO | Source: Ambulatory Visit | Attending: Obstetrics & Gynecology | Admitting: Obstetrics & Gynecology

## 2022-06-18 ENCOUNTER — Ambulatory Visit (INDEPENDENT_AMBULATORY_CARE_PROVIDER_SITE_OTHER): Payer: BC Managed Care – PPO | Admitting: Obstetrics & Gynecology

## 2022-06-18 VITALS — BP 139/72 | HR 90 | Ht 72.0 in | Wt 292.0 lb

## 2022-06-18 DIAGNOSIS — N9419 Other specified dyspareunia: Secondary | ICD-10-CM

## 2022-06-18 DIAGNOSIS — Z124 Encounter for screening for malignant neoplasm of cervix: Secondary | ICD-10-CM

## 2022-06-18 DIAGNOSIS — N952 Postmenopausal atrophic vaginitis: Secondary | ICD-10-CM

## 2022-06-18 MED ORDER — ESTRADIOL 10 MCG VA TABS: ORAL_TABLET | VAGINAL | 12 refills | Status: AC

## 2022-06-18 NOTE — Progress Notes (Signed)
   New Patient Office Visit  Subjective   Patient ID: Julia Ashley, female    DOB: Jul 14, 1966  Age: 56 y.o. MRN: [id number]  Chief Complaint  Patient presents with   Vaginal Pain    During intercourse x years    HPI   56 yo married African American P0 here as a new patient with the issue of dyspareunia due to vaginal atrophy. This started about 3 years ago. She has tried a lubricant without help. She went through menopause around 8 years ago.  She denies a h/o abnormal pap smears, no recent pap seen on her patient portal.  Objective:     BP 139/72   Pulse 90   Ht 6' (1.829 m)   Wt 292 lb (132.5 kg)   BMI 39.60 kg/m    Physical Exam Well nourished, well hydrated Black female, no apparent distress EG- moderate atrophy Spec exam- marked vaginal atrophy, normal nulliparous cervix  Assessment & Plan:  Vulvovaginal atrophy and dyspareunia- vagifem (generic) 10 mcg twice weekly. She may use this 3 times per week for the first few weeks.  Screening for cervical cancer- pap smear with HPV cotesting sent  She will come back 1 year/prn sooner Problem List Items Addressed This Visit   None   No follow-ups on file.    Allie Bossier, MD

## 2022-06-20 LAB — CYTOLOGY - PAP
Comment: NEGATIVE
Diagnosis: NEGATIVE
High risk HPV: NEGATIVE

## 2022-06-26 IMAGING — MG MM DIGITAL SCREENING BILAT W/ TOMO AND CAD
6 of 10 series · 6 of 30 positions shown · non-contrast
Comparison: Previous exam(s).

CLINICAL DATA: Screening.

EXAM:
DIGITAL SCREENING BILATERAL MAMMOGRAM WITH TOMOSYNTHESIS AND CAD
TECHNIQUE: Bilateral screening digital craniocaudal and mediolateral oblique
mammograms were obtained. Bilateral screening digital breast
tomosynthesis was performed. The images were evaluated with
computer-aided detection.

[R CC synth-2D (1 of 2)]
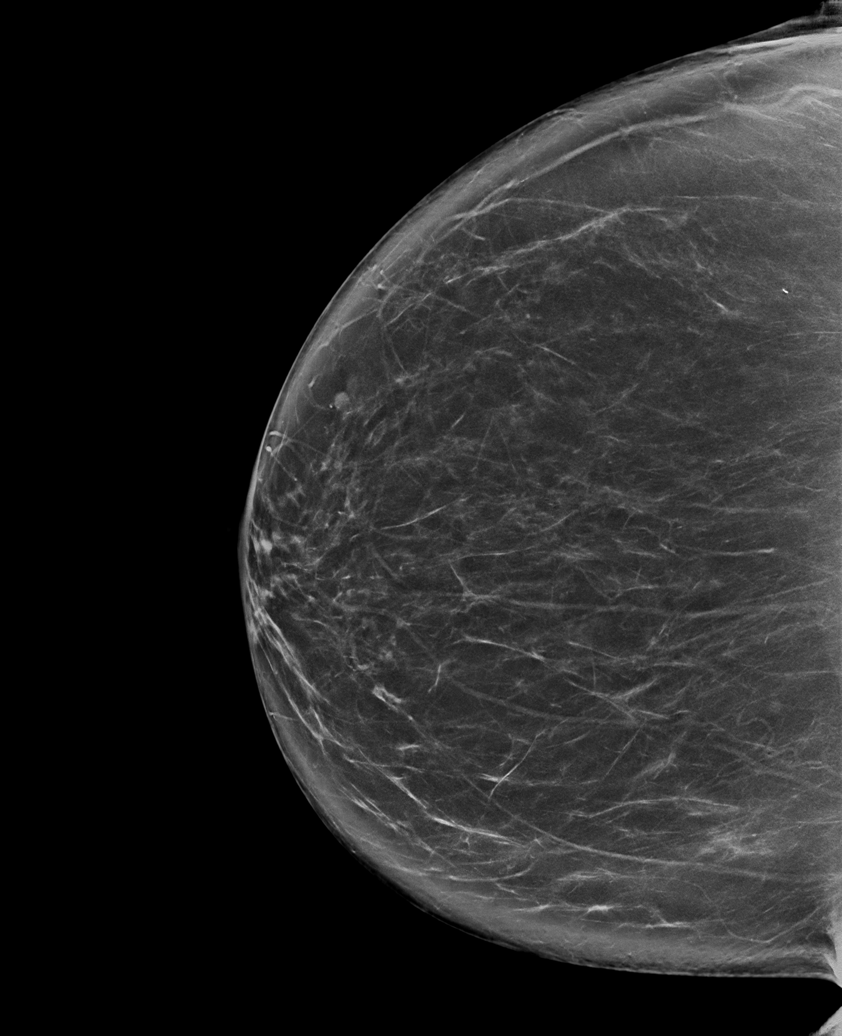

[R MLO synth-2D]
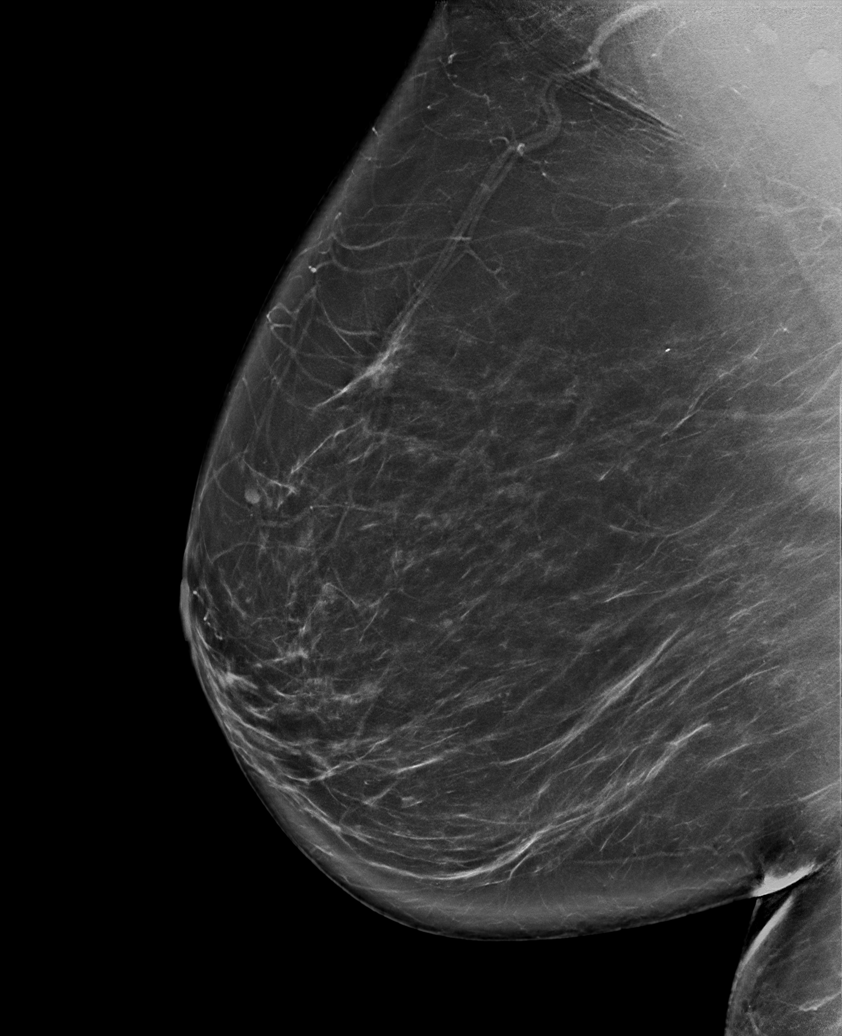

[R CC synth-2D (2 of 2)]
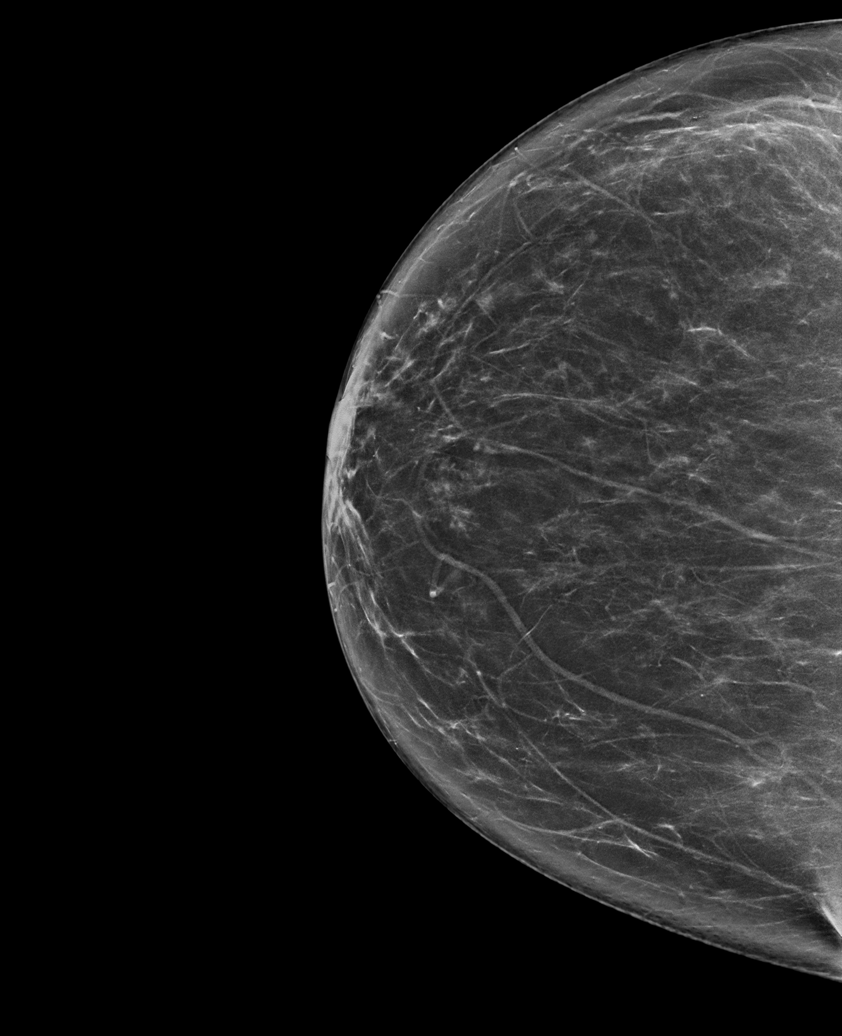

[L CC synth-2D]
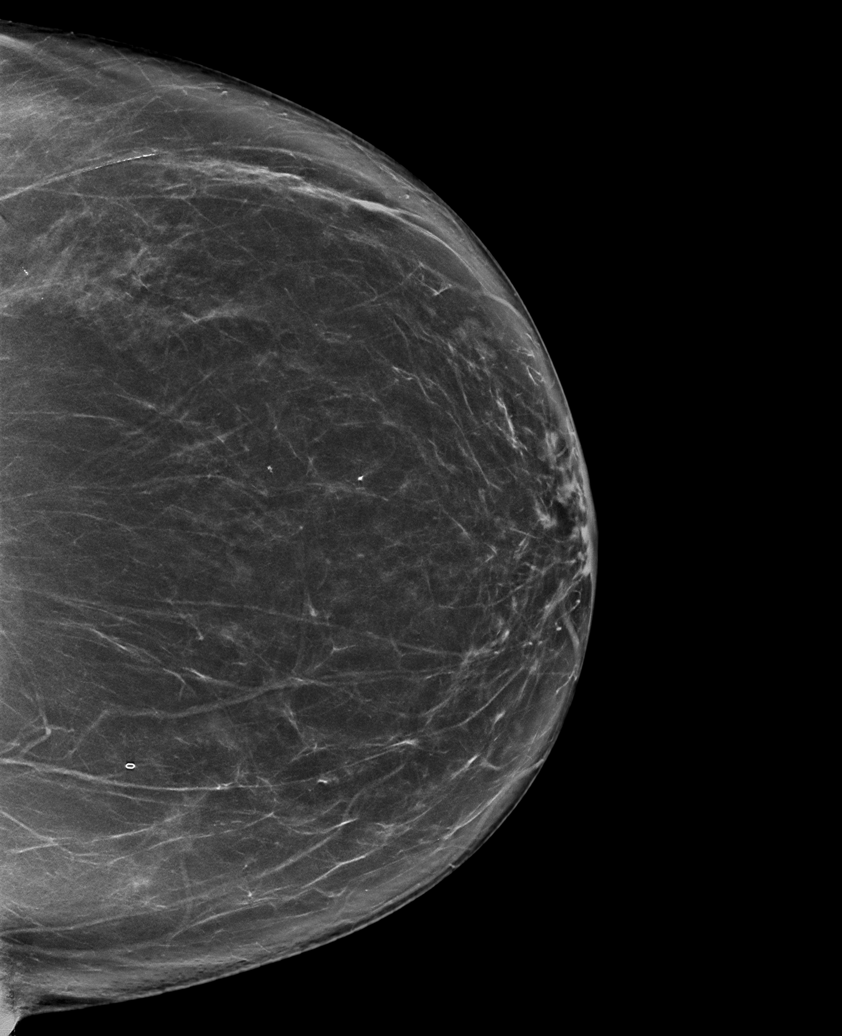

[L MLO synth-2D]
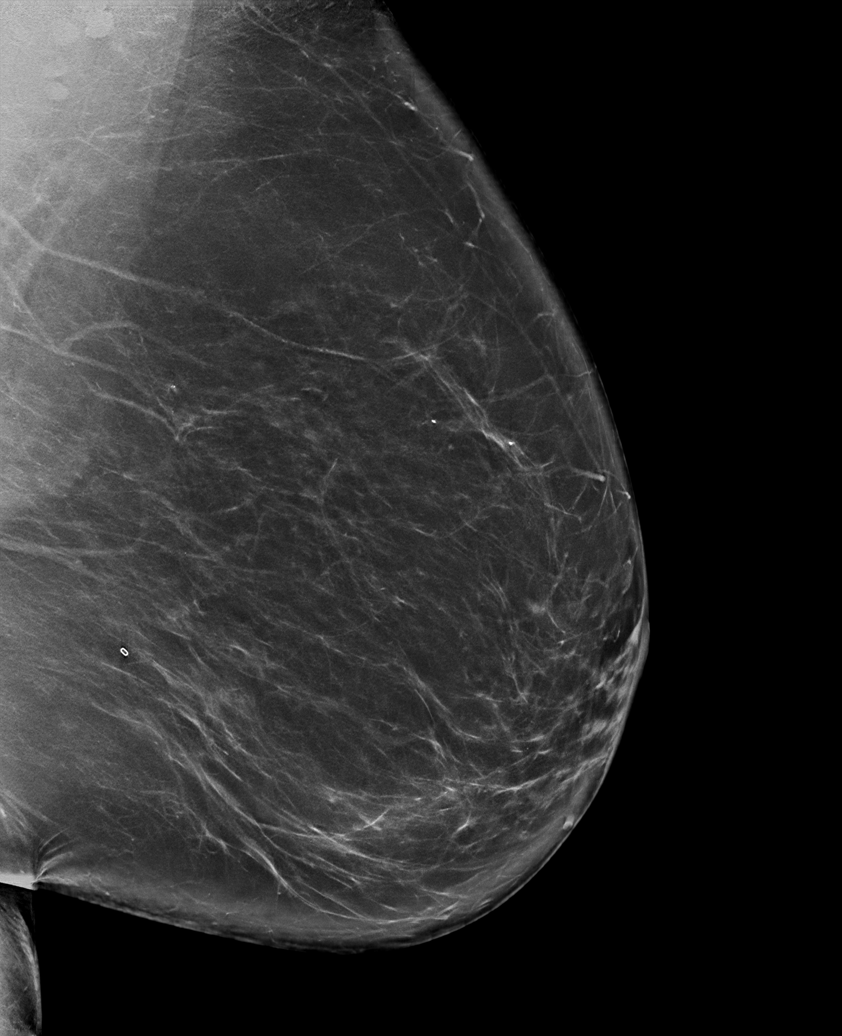

[R CC tomo · tomo slice 47/92.0]
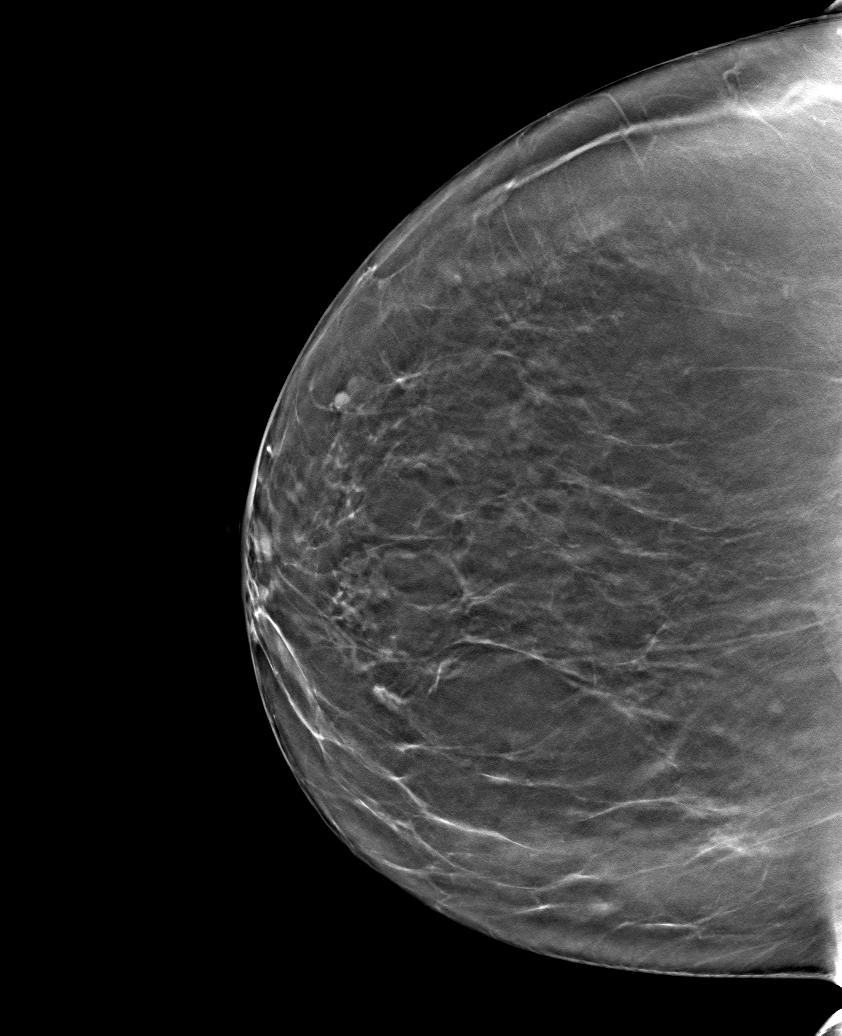

[6 of 30 positions shown; findings below may reference images not displayed]

ACR Breast Density Category b: There are scattered areas of
fibroglandular density.
FINDINGS: There are no findings suspicious for malignancy.
IMPRESSION: No mammographic evidence of malignancy. A result letter of this
screening mammogram will be mailed directly to the patient.

RECOMMENDATION:
Screening mammogram in one year. (Code:51-O-LD2)

BI-RADS CATEGORY  1: Negative.

## 2022-07-12 ENCOUNTER — Ambulatory Visit: Payer: BC Managed Care – PPO | Admitting: Orthopaedic Surgery

## 2022-07-12 DIAGNOSIS — M1711 Unilateral primary osteoarthritis, right knee: Secondary | ICD-10-CM | POA: Diagnosis not present

## 2022-07-12 DIAGNOSIS — M1611 Unilateral primary osteoarthritis, right hip: Secondary | ICD-10-CM | POA: Diagnosis not present

## 2022-07-12 DIAGNOSIS — M1712 Unilateral primary osteoarthritis, left knee: Secondary | ICD-10-CM | POA: Diagnosis not present

## 2022-08-22 NOTE — Progress Notes (Signed)
Established Patient Office Visit  Subjective    Patient ID: Julia Ashley, female    DOB: January 23, 1966  Age: 57 y.o. MRN: [id number]  CC:  Chief Complaint  Patient presents with   Follow-up   Hypertension    HPI Julia Ashley presents to follow up on chronic medical conditions. Does want to discuss skin change on the bottom of her right foot. Starts as a hard knot but will be filled with purulent material that has to be squeezed. Last time she had this was in may, does not have currently. Was given Lotrimin cream which helped somewhat.   Hypertension: -Medications: HCTZ 12.5 mg, Amlodipine 5 mg BID -Failed Meds: Lisinopril hives and swelling  -Patient is compliant with above medications and reports no side effects. -Checking BP at home (average):130-143/89 -Denies any SOB, CP, vision changes, LE edema or symptoms of hypotension -Exercise:water aerobics, which she is enjoying   HLD: -Medications: Nothing -Last lipid panel: Lipid Panel     Component Value Date/Time   CHOL 211 (H) 11/22/2021 1416   TRIG 70 11/22/2021 1416   HDL 81 11/22/2021 1416   CHOLHDL 2.6 11/22/2021 1416   LDLCALC 114 (H) 11/22/2021 1416   The 10-year ASCVD risk score (Arnett DK, et al., 2019) is: 5.5%   Values used to calculate the score:     Age: 57 years     Sex: Female     Is Non-Hispanic African American: Yes     Diabetic: No     Tobacco smoker: No     Systolic Blood Pressure: 938 mmHg     Is BP treated: Yes     HDL Cholesterol: 81 mg/dL     Total Cholesterol: 211 mg/dL  Dyspareunia:  -Started within the last few years  -Now seeing Gynecology, was started on estradiol 10 mg vaginal tablets but pharmacy was out of them so uncertain if this is working yet  Health Maintenance: -Blood work due -[place] 11/23 - Birads-2 -Pap 11/23 negative -Colonoscopy 12/2016, one polyp removed unable to view pathology, repeat in 10 years  Outpatient Encounter Medications as of 08/23/2022  Medication  Sig   amLODipine (NORVASC) 5 MG tablet Take 1 tablet by mouth daily.   Estradiol 10 MCG TABS vaginal tablet 1 tablet per vagina 2 times per week.   hydrochlorothiazide (HYDRODIURIL) 12.5 MG tablet Take 1 tablet (12.5 mg total) by mouth daily.   No facility-administered encounter medications on file as of 08/23/2022.    Past Medical History:  Diagnosis Date   Hypertension     Past Surgical History:  Procedure Laterality Date   BREAST BIOPSY  2009   THYROIDECTOMY, PARTIAL      Family History  Problem Relation Age of Onset   Breast cancer Mother 86   Hypertension Father    Heart attack Father    Breast cancer Cousin        twice, not sure of age    Social History   Socioeconomic History   Marital status: Married    Spouse name: Not on file   Number of children: Not on file   Years of education: Not on file   Highest education level: Not on file  Occupational History   Not on file  Tobacco Use   Smoking status: Never   Smokeless tobacco: Never  Vaping Use   Vaping Use: Never used  Substance and Sexual Activity   Alcohol use: Yes    Comment: very rare   Drug use: Never  Sexual activity: Yes    Birth control/protection: Post-menopausal  Other Topics Concern   Not on file  Social History Narrative   Not on file   Social Determinants of Health   Financial Resource Strain: Not on file  Food Insecurity: Not on file  Transportation Needs: Not on file  Physical Activity: Not on file  Stress: Not on file  Social Connections: Not on file  Intimate Partner Violence: Not on file    Review of Systems  Constitutional:  Negative for chills, fever and malaise/fatigue.  Eyes:  Negative for blurred vision.  Respiratory:  Negative for cough and shortness of breath.   Cardiovascular:  Negative for chest pain and palpitations.  Gastrointestinal:  Negative for abdominal pain, nausea and vomiting.  Neurological:  Negative for dizziness and headaches.        Objective     BP 138/86   Pulse 100   Temp 98.5 F (36.9 C) (Oral)   Resp 16   Ht 6' (1.829 m)   Wt 291 lb 9.6 oz (132.3 kg)   SpO2 97%   BMI 39.55 kg/m   Physical Exam Constitutional:      Appearance: Normal appearance.  HENT:     Head: Normocephalic and atraumatic.  Eyes:     Conjunctiva/sclera: Conjunctivae normal.  Cardiovascular:     Rate and Rhythm: Normal rate and regular rhythm.  Pulmonary:     Effort: Pulmonary effort is normal.     Breath sounds: Normal breath sounds.  Musculoskeletal:     Right lower leg: No edema.     Left lower leg: No edema.     Comments: Wearing compressions stockings   Skin:    General: Skin is warm and dry.  Neurological:     General: No focal deficit present.     Mental Status: She is alert. Mental status is at baseline.  Psychiatric:        Mood and Affect: Mood normal.        Behavior: Behavior normal.       Assessment & Plan:   1. Primary hypertension: Blood pressure borderline today at last visit as well.  Will switch amlodipine to 10 mg once daily, she is also on HCTZ 12.5 mg daily.  Kidney function was up just slightly on last set of labs, will recheck.  Kidney function and electrolytes appropriate, plan to increase HCTZ to 25 mg a day.  If creatinine still up, consider starting a low-dose ARB.  She cannot take ACE inhibitors due to history of allergy.  New medication is added, she will be seen for blood pressure recheck in 1 month.  - amLODipine (NORVASC) 10 MG tablet; Take 1 tablet (10 mg total) by mouth daily.  Dispense: 90 tablet; Refill: 1 - Basic Metabolic Panel (BMET)  2. Normocytic anemia: Slightly anemic on last set of labs, recheck labs.  - CBC w/Diff/Platelet - Lactate dehydrogenase; Future - Haptoglobin  3. Tinea pedis of right foot: Uncertain of diagnosis, as patient does not have skin abnormality currently.  Will send ketoconazole cream as well as a topical antibiotic should symptoms reoccur.   - ketoconazole  (NIZORAL) 2 % cream; Apply 1 Application topically daily.  Dispense: 15 g; Refill: 0 - clindamycin (CLINDAGEL) 1 % gel; Apply topically 2 (two) times daily.  Dispense: 30 g; Refill: 0  Return in about 6 months (around 02/21/2023).   Teodora Medici, DO

## 2022-08-23 ENCOUNTER — Encounter: Payer: Self-pay | Admitting: Internal Medicine

## 2022-08-23 ENCOUNTER — Ambulatory Visit: Payer: No Typology Code available for payment source | Admitting: Internal Medicine

## 2022-08-23 VITALS — BP 138/86 | HR 100 | Temp 98.5°F | Resp 16 | Ht 72.0 in | Wt 291.6 lb

## 2022-08-23 DIAGNOSIS — D649 Anemia, unspecified: Secondary | ICD-10-CM

## 2022-08-23 DIAGNOSIS — B353 Tinea pedis: Secondary | ICD-10-CM | POA: Diagnosis not present

## 2022-08-23 DIAGNOSIS — I10 Essential (primary) hypertension: Secondary | ICD-10-CM | POA: Diagnosis not present

## 2022-08-23 MED ORDER — CLINDAMYCIN PHOSPHATE 1 % EX GEL: Freq: Two times a day (BID) | CUTANEOUS | 0 refills | Status: DC

## 2022-08-23 MED ORDER — AMLODIPINE BESYLATE 10 MG PO TABS: 10.0000 mg | ORAL_TABLET | Freq: Every day | ORAL | 1 refills | Status: DC

## 2022-08-23 MED ORDER — KETOCONAZOLE 2 % EX CREA: 1.0000 | TOPICAL_CREAM | Freq: Every day | CUTANEOUS | 0 refills | Status: DC

## 2022-08-24 LAB — HAPTOGLOBIN: Haptoglobin: 225 mg/dL — ABNORMAL HIGH (ref 43–212)

## 2022-08-24 LAB — CBC WITH DIFFERENTIAL/PLATELET
Absolute Monocytes: 184 cells/uL — ABNORMAL LOW (ref 200–950)
Basophils Absolute: 22 cells/uL (ref 0–200)
Basophils Relative: 0.4 %
Eosinophils Absolute: 70 cells/uL (ref 15–500)
Eosinophils Relative: 1.3 %
HCT: 36.1 % (ref 35.0–45.0)
Hemoglobin: 11.9 g/dL (ref 11.7–15.5)
Lymphs Abs: 1723 cells/uL (ref 850–3900)
MCH: 29.3 pg (ref 27.0–33.0)
MCHC: 33 g/dL (ref 32.0–36.0)
MCV: 88.9 fL (ref 80.0–100.0)
MPV: 10.5 fL (ref 7.5–12.5)
Monocytes Relative: 3.4 %
Neutro Abs: 3402 cells/uL (ref 1500–7800)
Neutrophils Relative %: 63 %
Platelets: 296 10*3/uL (ref 140–400)
RBC: 4.06 10*6/uL (ref 3.80–5.10)
RDW: 14.8 % (ref 11.0–15.0)
Total Lymphocyte: 31.9 %
WBC: 5.4 10*3/uL (ref 3.8–10.8)

## 2022-08-24 LAB — BASIC METABOLIC PANEL
BUN: 12 mg/dL (ref 7–25)
CO2: 30 mmol/L (ref 20–32)
Calcium: 8.9 mg/dL (ref 8.6–10.4)
Chloride: 106 mmol/L (ref 98–110)
Creat: 0.94 mg/dL (ref 0.50–1.03)
Glucose, Bld: 85 mg/dL (ref 65–99)
Potassium: 4.3 mmol/L (ref 3.5–5.3)
Sodium: 141 mmol/L (ref 135–146)

## 2022-08-26 ENCOUNTER — Other Ambulatory Visit: Payer: Self-pay | Admitting: Internal Medicine

## 2022-08-26 DIAGNOSIS — I10 Essential (primary) hypertension: Secondary | ICD-10-CM

## 2022-08-26 MED ORDER — HYDROCHLOROTHIAZIDE 25 MG PO TABS: 25.0000 mg | ORAL_TABLET | Freq: Every day | ORAL | 1 refills | Status: DC

## 2022-09-01 ENCOUNTER — Telehealth: Payer: Self-pay | Admitting: Internal Medicine

## 2022-09-01 DIAGNOSIS — I10 Essential (primary) hypertension: Secondary | ICD-10-CM

## 2022-09-02 NOTE — Telephone Encounter (Signed)
Requested by interface surescripts. Medication discontinued 08/26/22. Dose changed.  Requested Prescriptions  Refused Prescriptions Disp Refills   hydrochlorothiazide (HYDRODIURIL) 12.5 MG tablet [Pharmacy Med Name: HYDROCHLOROTHIAZIDE 12.5MG  TABLETS] 90 tablet 0    Sig: TAKE 1 TABLET(12.5 MG) BY MOUTH DAILY     Cardiovascular: Diuretics - Thiazide Passed - 09/01/2022 12:32 PM      Passed - Cr in normal range and within 180 days    Creat  Date Value Ref Range Status  08/23/2022 0.94 0.50 - 1.03 mg/dL Final         Passed - K in normal range and within 180 days    Potassium  Date Value Ref Range Status  08/23/2022 4.3 3.5 - 5.3 mmol/L Final         Passed - Na in normal range and within 180 days    Sodium  Date Value Ref Range Status  08/23/2022 141 135 - 146 mmol/L Final  01/22/2021 144 134 - 144 mmol/L Final         Passed - Last BP in normal range    BP Readings from Last 1 Encounters:  08/23/22 138/86         Passed - Valid encounter within last 6 months    Recent Outpatient Visits           1 week ago Primary hypertension   JAARS, DO   3 months ago Primary hypertension   [facility] Teodora Medici, DO   9 months ago Primary hypertension   [facility] Teodora Medici, DO       Future Appointments             In 5 months Teodora Medici, [facility], [facility]

## 2022-10-10 ENCOUNTER — Ambulatory Visit: Payer: No Typology Code available for payment source | Admitting: Internal Medicine

## 2022-10-10 NOTE — Progress Notes (Signed)
Established Patient Office Visit  Subjective    Patient ID: Julia Ashley, female    DOB: Nov 30, 1965  Age: 57 y.o. MRN: [id number]  CC:  Chief Complaint  Patient presents with   Follow-up   Hypertension    HPI Julia Ashley presents to follow up on chronic medical conditions.   Hypertension: -Medications: HCTZ increased to 25 mg, Amlodipine 5 mg BID -Failed Meds: Lisinopril hives and swelling  -Patient is compliant with above medications and reports no side effects. -Checking BP at home (average):120-130/70-80 -Denies any SOB, CP, vision changes, LE edema or symptoms of hypotension -Exercise:water aerobics, which she is enjoying   HLD: -Medications: Nothing -Last lipid panel: Lipid Panel     Component Value Date/Time   CHOL 211 (H) 11/22/2021 1416   TRIG 70 11/22/2021 1416   HDL 81 11/22/2021 1416   CHOLHDL 2.6 11/22/2021 1416   LDLCALC 114 (H) 11/22/2021 1416   The 10-year ASCVD risk score (Arnett DK, et al., 2019) is: 4.8%   Values used to calculate the score:     Age: 44 years     Sex: Female     Is Non-Hispanic African American: Yes     Diabetic: No     Tobacco smoker: No     Systolic Blood Pressure: 408 mmHg     Is BP treated: Yes     HDL Cholesterol: 81 mg/dL     Total Cholesterol: 211 mg/dL  Dyspareunia:  -Started within the last few years  -Now seeing Gynecology, was started on estradiol 10 mg vaginal tablets but pharmacy was out of them so uncertain if this is working yet  Health Maintenance: -Blood work UTD -Mammogram 11/23 - Birads-2 -Pap 11/23 negative -Colonoscopy 12/2016, one polyp removed unable to view pathology, repeat in 10 years  Outpatient Encounter Medications as of 10/11/2022  Medication Sig   amLODipine (NORVASC) 10 MG tablet Take 1 tablet (10 mg total) by mouth daily.   clindamycin (CLINDAGEL) 1 % gel Apply topically 2 (two) times daily.   Estradiol 10 MCG TABS vaginal tablet 1 tablet per vagina 2 times per week.    hydrochlorothiazide (HYDRODIURIL) 25 MG tablet Take 1 tablet (25 mg total) by mouth daily.   ketoconazole (NIZORAL) 2 % cream Apply 1 Application topically daily.   No facility-administered encounter medications on file as of 10/11/2022.    Past Medical History:  Diagnosis Date   Hypertension     Past Surgical History:  Procedure Laterality Date   BREAST BIOPSY  2009   THYROIDECTOMY, PARTIAL      Family History  Problem Relation Age of Onset   Breast cancer Mother 27   Hypertension Father    Heart attack Father    Breast cancer Cousin        twice, not sure of age    Social History   Socioeconomic History   Marital status: Married    Spouse name: Not on file   Number of children: Not on file   Years of education: Not on file   Highest education level: Not on file  Occupational History   Not on file  Tobacco Use   Smoking status: Never   Smokeless tobacco: Never  Vaping Use   Vaping Use: Never used  Substance and Sexual Activity   Alcohol use: Yes    Comment: very rare   Drug use: Never   Sexual activity: Yes    Birth control/protection: Post-menopausal  Other Topics Concern   Not on file  Social History Narrative   Not on file   Social Determinants of Health   Financial Resource Strain: Not on file  Food Insecurity: Not on file  Transportation Needs: Not on file  Physical Activity: Not on file  Stress: Not on file  Social Connections: Not on file  Intimate Partner Violence: Not on file    Review of Systems  Constitutional:  Negative for chills, fever and malaise/fatigue.  Eyes:  Negative for blurred vision.  Respiratory:  Negative for cough and shortness of breath.   Cardiovascular:  Negative for chest pain and palpitations.  Gastrointestinal:  Negative for abdominal pain, nausea and vomiting.  Neurological:  Negative for dizziness and headaches.        Objective    BP 132/84   Pulse (!) 106   Temp 98 F (36.7 C)   Resp 18   Ht 6' (1.829  m)   Wt 297 lb 11.2 oz (135 kg)   SpO2 98%   BMI 40.38 kg/m   Physical Exam Constitutional:      Appearance: Normal appearance.  HENT:     Head: Normocephalic and atraumatic.  Eyes:     Conjunctiva/sclera: Conjunctivae normal.  Cardiovascular:     Rate and Rhythm: Normal rate and regular rhythm.  Pulmonary:     Effort: Pulmonary effort is normal.     Breath sounds: Normal breath sounds.  Musculoskeletal:     Comments: Wearing compressions stockings, mild chronic swelling BLE present   Skin:    General: Skin is warm and dry.  Neurological:     General: No focal deficit present.     Mental Status: She is alert. Mental status is at baseline.  Psychiatric:        Mood and Affect: Mood normal.        Behavior: Behavior normal.       Assessment & Plan:   1. Primary hypertension: Blood pressure improved since increasing dose. Continue HCTZ 25 mg in the morning, Amlodipine 5 mg at night. Continue to monitor BP at home. Follow up scheduled for July, will recheck kidney function then since increasing medication. Brief discussion about weight loss medications today, plan to check A1c next time as well.   - hydrochlorothiazide (HYDRODIURIL) 25 MG tablet; Take 1 tablet (25 mg total) by mouth daily.  Dispense: 90 tablet; Refill: 1   Return for already scheduled .   Teodora Medici, DO

## 2022-10-11 ENCOUNTER — Ambulatory Visit: Payer: No Typology Code available for payment source | Admitting: Internal Medicine

## 2022-10-11 ENCOUNTER — Encounter: Payer: Self-pay | Admitting: Internal Medicine

## 2022-10-11 VITALS — BP 132/84 | HR 106 | Temp 98.0°F | Resp 18 | Ht 72.0 in | Wt 297.7 lb

## 2022-10-11 DIAGNOSIS — I10 Essential (primary) hypertension: Secondary | ICD-10-CM

## 2022-10-11 MED ORDER — HYDROCHLOROTHIAZIDE 25 MG PO TABS: 25.0000 mg | ORAL_TABLET | Freq: Every day | ORAL | 1 refills | Status: DC

## 2022-11-25 ENCOUNTER — Ambulatory Visit
Admission: RE | Admit: 2022-11-25 | Discharge: 2022-11-25 | Disposition: A | Discharge status: Home / Self Care | Payer: No Typology Code available for payment source | Attending: Physician Assistant | Admitting: Physician Assistant

## 2022-11-25 ENCOUNTER — Ambulatory Visit
Admission: RE | Admit: 2022-11-25 | Discharge: 2022-11-25 | Disposition: A | Discharge status: Home / Self Care | Payer: No Typology Code available for payment source | Source: Ambulatory Visit | Attending: Physician Assistant

## 2022-11-25 ENCOUNTER — Ambulatory Visit: Payer: No Typology Code available for payment source | Admitting: Physician Assistant

## 2022-11-25 VITALS — BP 130/82 | HR 98 | Temp 97.8°F | Resp 18 | Ht 72.0 in | Wt 303.6 lb

## 2022-11-25 DIAGNOSIS — M79605 Pain in left leg: Secondary | ICD-10-CM

## 2022-11-25 NOTE — Progress Notes (Signed)
Acute Office Visit   Patient: Julia Ashley   DOB: 12-22-1965   57 y.o. Female  MRN: [id number] Visit Date: 11/25/2022  Today's healthcare provider: Oswaldo Conroy Elanna Bert, PA-C  Introduced myself to the patient as a Secondary school teacher and provided education on APPs in clinical practice.    Chief Complaint  Patient presents with   Leg Pain    Lower left leg   Subjective    HPI HPI     Leg Pain    Additional comments: Lower left leg      Last edited by Marcos Eke, CMA on 11/25/2022 11:46 AM.        Pain in lower left leg  Onset: sudden  Duration: since Friday night  She reports she went walking on Friday - she denies other significant changes to activity level. She reports walking for about 15 minutes to walk her dog  She denies injury or trauma to her knowledge  Location: anterior left lower leg Radiation: none Pain level and character: 8-9/10 with movement, sharp pain  Other associated symptoms: she denies bruising, skin changes but admits to some mild swelling compared to the right leg  Interventions: ice and heat, elevating leg, compression socks, Biofreeze  She denies recent travel or periods of immobility outside of sitting for her job Alleviating: nothing other than rest  Aggravating: dorsiflexion and walking seem to aggravate it    She had plantar fasciitis surgery on her left foot about a year ago   Medications: Outpatient Medications Prior to Visit  Medication Sig   amLODipine (NORVASC) 10 MG tablet Take 1 tablet (10 mg total) by mouth daily.   clindamycin (CLINDAGEL) 1 % gel Apply topically 2 (two) times daily.   Estradiol 10 MCG TABS vaginal tablet 1 tablet per vagina 2 times per week.   hydrochlorothiazide (HYDRODIURIL) 25 MG tablet Take 1 tablet (25 mg total) by mouth daily.   ketoconazole (NIZORAL) 2 % cream Apply 1 Application topically daily.   No facility-administered medications prior to visit.    Review of Systems  Constitutional:  Negative for  chills and fever.  Respiratory:  Negative for cough, chest tightness, shortness of breath and wheezing.   Cardiovascular:  Negative for chest pain and palpitations.  Musculoskeletal:  Positive for myalgias.  Hematological:  Negative for adenopathy. Does not bruise/bleed easily.       Objective    BP 130/82   Pulse 98   Temp 97.8 F (36.6 C)   Resp 18   Ht 6' (1.829 m)   Wt (!) 303 lb 9.6 oz (137.7 kg)   SpO2 98%   BMI 41.18 kg/m    Physical Exam Vitals reviewed.  Constitutional:      General: She is awake.     Appearance: Normal appearance. She is well-developed and well-groomed.  HENT:     Head: Normocephalic and atraumatic.  Eyes:     Extraocular Movements: Extraocular movements intact.     Conjunctiva/sclera: Conjunctivae normal.  Pulmonary:     Effort: Pulmonary effort is normal.  Musculoskeletal:     Cervical back: Normal range of motion and neck supple.     Right lower leg: Swelling present. No tenderness.     Left lower leg: Swelling and tenderness (As indicated by 1) present.     Right ankle: Swelling present. No ecchymosis. Normal pulse.     Right Achilles Tendon: No tenderness.     Left ankle: Swelling present. No ecchymosis  or lacerations. No tenderness. Decreased range of motion. Normal pulse.     Left Achilles Tendon: No tenderness.     Right foot: Normal. Normal range of motion and normal capillary refill. No Charcot foot, foot drop or crepitus. Normal pulse.     Left foot: Normal. Normal range of motion and normal capillary refill. No Charcot foot, foot drop or tenderness. Normal pulse.       Legs:     Comments: Ankle and Foot ROM findings on left:  Dorsiflexion and plantar flexion reduced in comparison to right foot Inversion and eversion are reduced compared to right side Right and left calf circumference: 18 inches Right and left superior ankle circumference: 12 inches      Skin:    General: Skin is warm and dry.  Neurological:     General:  No focal deficit present.     Mental Status: She is alert and oriented to person, place, and time. Mental status is at baseline.     GCS: GCS eye subscore is 4. GCS verbal subscore is 5. GCS motor subscore is 6.     Cranial Nerves: Cranial nerves 2-12 are intact.     Motor: Motor function is intact.  Psychiatric:        Mood and Affect: Mood normal.        Behavior: Behavior normal. Behavior is cooperative.        Thought Content: Thought content normal.        Judgment: Judgment normal.       No results found for any visits on 11/25/22.  Assessment & Plan      No follow-ups on file.       Problem List Items Addressed This Visit   None Visit Diagnoses     Pain of left anterior lower extremity    -  Primary Acute, new concern Ongoing since Friday  ROM is reduced compared to right lower extremity and ankle but there equal swelling in both lower extremities Mild redness along anterior shin but no rash or increased warmth at this time lowers my suspicion for cellulitis or another such skin etiology  Suspect muscular injury at this time but will get ankle and tib/fib imaging to rule out stress fracture or other osseous injury  Recommend she alternate Tylenol and NSAID of choice for pain at this time Recommend gentle stretches and exercises to avoid stiffness as tolerated May need to refer to Ortho if not improving os as dictated by imaging results.  Follow up as needed for persistent or progressing symptoms     Relevant Orders   DG Ankle Complete Left   DG Tibia/Fibula Left        No follow-ups on file.   I, Ira Busbin E Edwina Grossberg, PA-C, have reviewed all documentation for this visit. The documentation on 11/25/22 for the exam, diagnosis, procedures, and orders are all accurate and complete.   Jacquelin Hawking, MHS, PA-C [facility]

## 2022-11-27 NOTE — Progress Notes (Signed)
  Your imaging did not show any skeletal fractures or dislocation in the area you indicated as hurting. There was some mild soft tissue swelling which likely means there was a muscular injury as we suspected. I recommend that you continue with the plan we discussed during your apt for now and if this is not improving we can reassess in a few weeks and make a referral to Orthopedics.

## 2022-11-27 NOTE — Progress Notes (Signed)
Your imaging did not show any skeletal fractures or dislocation in the area you indicated as hurting. There was some mild soft tissue swelling which likely means there was a muscular injury as we suspected. I recommend that you continue with the plan we discussed during your apt for now and if this is not improving we can reassess in a few weeks and make a referral to Orthopedics.

## 2023-02-18 NOTE — Progress Notes (Signed)
Established Patient Office Visit  Subjective    Patient ID: Julia Ashley, female    DOB: Jan 15, 1966  Age: 57 y.o. MRN: [id number]  CC:  Chief Complaint  Patient presents with   Follow-up    HPI Julia Ashley presents to follow up on chronic medical conditions. Patient states she is doing well and has no questions or concerns today.   Hypertension: -Medications: HCTZ 25 mg, Amlodipine 10 mg  -Failed Meds: Lisinopril hives and swelling  -Patient is compliant with above medications and reports no side effects. -Checking BP at home (average):120-130/70-80 -Denies any SOB, CP, vision changes, LE edema or symptoms of hypotension -Exercise:water aerobics  HLD: -Medications: Nothing -Last lipid panel: Lipid Panel     Component Value Date/Time   CHOL 211 (H) 11/22/2021 1416   TRIG 70 11/22/2021 1416   HDL 81 11/22/2021 1416   CHOLHDL 2.6 11/22/2021 1416   LDLCALC 114 (H) 11/22/2021 1416   The 10-year ASCVD risk score (Arnett DK, et al., 2019) is: 3.7%   Values used to calculate the score:     Age: 23 years     Sex: Female     Is Non-Hispanic African American: Yes     Diabetic: No     Tobacco smoker: No     Systolic Blood Pressure: 122 mmHg     Is BP treated: Yes     HDL Cholesterol: 81 mg/dL     Total Cholesterol: 211 mg/dL  Dyspareunia:  -Started within the last few years  -Now seeing Gynecology, was started on estradiol 10 mg vaginal tablets but does not feel like this is working well   Health Maintenance: -Blood work due -Mammogram 11/23 - Birads-2 -Pap 11/23 negative -Colonoscopy 12/2016, one polyp removed unable to view pathology, repeat in 10 years - obtaining records  Outpatient Encounter Medications as of 02/21/2023  Medication Sig   amLODipine (NORVASC) 10 MG tablet Take 1 tablet (10 mg total) by mouth daily.   clindamycin (CLINDAGEL) 1 % gel Apply topically 2 (two) times daily.   Estradiol 10 MCG TABS vaginal tablet 1 tablet per vagina 2 times per  week.   hydrochlorothiazide (HYDRODIURIL) 25 MG tablet Take 1 tablet (25 mg total) by mouth daily.   ketoconazole (NIZORAL) 2 % cream Apply 1 Application topically daily.   No facility-administered encounter medications on file as of 02/21/2023.    Past Medical History:  Diagnosis Date   Hypertension     Past Surgical History:  Procedure Laterality Date   BREAST BIOPSY  2009   THYROIDECTOMY, PARTIAL      Family History  Problem Relation Age of Onset   Breast cancer Mother 67   Hypertension Father    Heart attack Father    Breast cancer Cousin        twice, not sure of age    Social History   Socioeconomic History   Marital status: Married    Spouse name: Not on file   Number of children: Not on file   Years of education: Not on file   Highest education level: Master's degree (e.g., MA, MS, MEng, MEd, MSW, MBA)  Occupational History   Not on file  Tobacco Use   Smoking status: Never   Smokeless tobacco: Never  Vaping Use   Vaping status: Never Used  Substance and Sexual Activity   Alcohol use: Yes    Comment: very rare   Drug use: Never   Sexual activity: Yes    Birth control/protection: Post-menopausal  Other Topics Concern   Not on file  Social History Narrative   Not on file   Social Determinants of Health   Financial Resource Strain: Low Risk  (11/25/2022)   Overall Financial Resource Strain (CARDIA)    Difficulty of Paying Living Expenses: Not very hard  Food Insecurity: No Food Insecurity (11/25/2022)   Hunger Vital Sign    Worried About Running Out of Food in the Last Year: Never true    Ran Out of Food in the Last Year: Never true  Transportation Needs: No Transportation Needs (11/25/2022)   PRAPARE - Administrator, Civil Service (Medical): No    Lack of Transportation (Non-Medical): No  Physical Activity: Insufficiently Active (11/25/2022)   Exercise Vital Sign    Days of Exercise per Week: 1 day    Minutes of Exercise per Session:  20 min  Stress: No Stress Concern Present (11/25/2022)   [employer] of Occupational Health - Occupational Stress Questionnaire    Feeling of Stress : Only a little  Social Connections: Socially Integrated (11/25/2022)   Social Connection and Isolation Panel [NHANES]    Frequency of Communication with Friends and Family: Three times a week    Frequency of Social Gatherings with Friends and Family: Once a week    Attends Religious Services: More than 4 times per year    Active Member of [employer] or Organizations: Yes    Attends Banker Meetings: 1 to 4 times per year    Marital Status: Married  Catering manager Violence: Not on file    Review of Systems  Constitutional:  Negative for chills and fever.  Eyes:  Negative for blurred vision.  Respiratory:  Negative for cough and shortness of breath.   Cardiovascular:  Negative for chest pain and palpitations.  Neurological:  Negative for dizziness and headaches.        Objective    BP 122/86   Pulse (!) 101   Temp 98.1 F (36.7 C)   Resp 18   Ht 6' (1.829 m)   Wt (!) 301 lb 4.8 oz (136.7 kg)   SpO2 99%   BMI 40.86 kg/m   Physical Exam Constitutional:      Appearance: Normal appearance.  HENT:     Head: Normocephalic and atraumatic.  Eyes:     Conjunctiva/sclera: Conjunctivae normal.  Cardiovascular:     Rate and Rhythm: Normal rate and regular rhythm.  Pulmonary:     Effort: Pulmonary effort is normal.     Breath sounds: Normal breath sounds.  Musculoskeletal:     Right lower leg: No edema.     Left lower leg: No edema.  Skin:    General: Skin is warm and dry.  Neurological:     General: No focal deficit present.     Mental Status: She is alert. Mental status is at baseline.  Psychiatric:        Mood and Affect: Mood normal.        Behavior: Behavior normal.       Assessment & Plan:   1. Primary hypertension: Blood pressure well controlled, obtain yearly labs today. Refill Amlodipine 10 mg  and hydrochlorothiazide 25 mg. Follow up in 1 year or sooner as needed.  - CBC w/Diff/Platelet - Basic Metabolic Panel (BMET) - amLODipine (NORVASC) 10 MG tablet; Take 1 tablet (10 mg total) by mouth daily.  Dispense: 90 tablet; Refill: 1 - hydrochlorothiazide (HYDRODIURIL) 25 MG tablet; Take 1 tablet (25 mg total)  by mouth daily.  Dispense: 90 tablet; Refill: 1  2. Lipid screening: Screening labs today.   - Lipid Profile   Return in about 1 year (around 02/21/2024).   Margarita Mail, DO

## 2023-02-21 ENCOUNTER — Ambulatory Visit (INDEPENDENT_AMBULATORY_CARE_PROVIDER_SITE_OTHER): Payer: 59 | Admitting: Internal Medicine

## 2023-02-21 ENCOUNTER — Encounter: Payer: Self-pay | Admitting: Internal Medicine

## 2023-02-21 VITALS — BP 122/86 | HR 101 | Temp 98.1°F | Resp 18 | Ht 72.0 in | Wt 301.3 lb

## 2023-02-21 DIAGNOSIS — Z13220 Encounter for screening for lipoid disorders: Secondary | ICD-10-CM | POA: Diagnosis not present

## 2023-02-21 DIAGNOSIS — I10 Essential (primary) hypertension: Secondary | ICD-10-CM | POA: Diagnosis not present

## 2023-02-21 MED ORDER — AMLODIPINE BESYLATE 10 MG PO TABS: 10.0000 mg | ORAL_TABLET | Freq: Every day | ORAL | 1 refills | Status: DC

## 2023-02-21 MED ORDER — HYDROCHLOROTHIAZIDE 25 MG PO TABS: 25.0000 mg | ORAL_TABLET | Freq: Every day | ORAL | 1 refills | Status: DC

## 2023-02-22 LAB — CBC WITH DIFFERENTIAL/PLATELET
Absolute Monocytes: 228 cells/uL (ref 200–950)
Basophils Absolute: 29 cells/uL (ref 0–200)
Basophils Relative: 0.5 %
Eosinophils Absolute: 80 cells/uL (ref 15–500)
Eosinophils Relative: 1.4 %
HCT: 35.6 % (ref 35.0–45.0)
Hemoglobin: 11.5 g/dL — ABNORMAL LOW (ref 11.7–15.5)
Lymphs Abs: 1721 cells/uL (ref 850–3900)
MCH: 29.9 pg (ref 27.0–33.0)
MCHC: 32.3 g/dL (ref 32.0–36.0)
MCV: 92.7 fL (ref 80.0–100.0)
MPV: 10.1 fL (ref 7.5–12.5)
Monocytes Relative: 4 %
Neutro Abs: 3642 cells/uL (ref 1500–7800)
Neutrophils Relative %: 63.9 %
Platelets: 293 10*3/uL (ref 140–400)
RBC: 3.84 10*6/uL (ref 3.80–5.10)
RDW: 15 % (ref 11.0–15.0)
Total Lymphocyte: 30.2 %
WBC: 5.7 10*3/uL (ref 3.8–10.8)

## 2023-02-22 LAB — BASIC METABOLIC PANEL
BUN/Creatinine Ratio: 10 (calc) (ref 6–22)
BUN: 10 mg/dL (ref 7–25)
CO2: 32 mmol/L (ref 20–32)
Calcium: 8.6 mg/dL (ref 8.6–10.4)
Chloride: 104 mmol/L (ref 98–110)
Creat: 1.04 mg/dL — ABNORMAL HIGH (ref 0.50–1.03)
Glucose, Bld: 90 mg/dL (ref 65–99)
Potassium: 3.6 mmol/L (ref 3.5–5.3)
Sodium: 141 mmol/L (ref 135–146)

## 2023-02-22 LAB — LIPID PANEL
Cholesterol: 217 mg/dL — ABNORMAL HIGH (ref <200)
HDL: 82 mg/dL (ref >=50)
LDL Cholesterol (Calc): 121 mg/dL (calc) — ABNORMAL HIGH
Non-HDL Cholesterol (Calc): 135 mg/dL (calc) — ABNORMAL HIGH (ref <130)
Total CHOL/HDL Ratio: 2.6 (calc) (ref <5.0)
Triglycerides: 57 mg/dL (ref <150)

## 2023-02-26 ENCOUNTER — Ambulatory Visit: Payer: 59 | Admitting: Family Medicine

## 2023-02-26 ENCOUNTER — Encounter: Payer: Self-pay | Admitting: Family Medicine

## 2023-02-26 VITALS — BP 138/86 | HR 94 | Temp 98.0°F | Resp 16 | Ht 72.0 in | Wt 304.9 lb

## 2023-02-26 DIAGNOSIS — M545 Low back pain, unspecified: Secondary | ICD-10-CM | POA: Diagnosis not present

## 2023-02-26 DIAGNOSIS — M25552 Pain in left hip: Secondary | ICD-10-CM

## 2023-02-26 MED ORDER — MELOXICAM 15 MG PO TABS: 15.0000 mg | ORAL_TABLET | Freq: Every day | ORAL | 0 refills | Status: DC

## 2023-02-26 NOTE — Patient Instructions (Addendum)
Piriformis stretches - look those up for when you are feeling a little better and hoping to return to your exercise - the piriformis stretches will hopefully help stretch things so that when you work out again the inflammation doesn't get this bad.  Try the meloxicam daily (do not take in the same day ibuprofen, aleve or naproxen) but do take tylenol over the counter  Do heat therapy - heating pad to low back/buttock area or grab some thermacare patches to the area    Please contact us if things flare up or get worse I would recommend PT eval and treatment or ortho depending on where the pain is

## 2023-02-26 NOTE — Progress Notes (Signed)
Patient ID: Julia Ashley, female    DOB: [birth year], 57 y.o.   MRN: [id number]  PCP: Margarita Mail, DO  Chief Complaint  Patient presents with   Hip Pain    Left side hip radiating down to thigh, pt started walking more onset for 3 weeks unsure if related.    Subjective:   Julia Ashley is a 57 y.o. female, presents to clinic with CC of the following:  HPI  Patient presents for left low back and buttock pain that seems to radiate around her left hip, onset within the last few weeks seem to get very severe after she started exercising and walking on an incline.  She tried leftover narcotics she had at home and she is not sure if that helped very much.  Her pain did not radiate to her groin or down her leg it did stay pretty localized and felt like some he was stabbing her.  At times pain was severe enough that she had to limp or walk on her left tiptoe She has not had any numbness tingling weakness, muscle spasms. She has not tried anything over-the-counter  She reports a history a while ago of low back strain -it does not feel exactly the same as that.   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 06/18/2022   Primary hypertension 11/22/2021   Primary osteoarthritis of right knee 10/12/2021   Primary osteoarthritis of left knee 10/12/2021      Current Outpatient Medications:    amLODipine (NORVASC) 10 MG tablet, Take 1 tablet (10 mg total) by mouth daily., Disp: 90 tablet, Rfl: 1   Estradiol 10 MCG TABS vaginal tablet, 1 tablet per vagina 2 times per week., Disp: 30 tablet, Rfl: 12   hydrochlorothiazide (HYDRODIURIL) 25 MG tablet, Take 1 tablet (25 mg total) by mouth daily., Disp: 90 tablet, Rfl: 1   meloxicam (MOBIC) 15 MG tablet, Take 1 tablet (15 mg total) by mouth daily., Disp: 30 tablet, Rfl: 0   Allergies  Allergen  Reactions   Aspirin Hives and Swelling   Lisinopril Swelling    Hives, swelling, cough, scratchy throat     Social History   Tobacco Use   Smoking status: Never   Smokeless tobacco: Never  Vaping Use   Vaping status: Never Used  Substance Use Topics   Alcohol use: Yes    Comment: very rare   Drug use: Never      Chart Review Today: I personally reviewed active problem list, medication list, allergies, family history, social history, health maintenance, notes from last encounter, lab results, imaging with the patient/caregiver today.   Review of Systems  Constitutional: Negative.   HENT: Negative.    Eyes: Negative.   Respiratory: Negative.    Cardiovascular: Negative.   Gastrointestinal: Negative.   Endocrine: Negative.   Genitourinary: Negative.   Musculoskeletal: Negative.   Skin: Negative.   Allergic/Immunologic: Negative.   Neurological: Negative.   Hematological: Negative.   Psychiatric/Behavioral: Negative.    All other systems reviewed and are negative.      Objective:   Vitals:   02/26/23 0923 02/26/23 0948  BP: (!) 152/88 138/86  Pulse: 94   Resp: 16   Temp: 98 F (36.7 C)   TempSrc: Oral   SpO2: 98%   Weight: (!) 304 lb 14.4 oz (  138.3 kg)   Height: 6' (1.829 m)     Body mass index is 41.35 kg/m.  Physical Exam Vitals and nursing note reviewed.  Constitutional:      General: She is not in acute distress.    Appearance: Normal appearance. She is well-developed. She is obese. She is not ill-appearing, toxic-appearing or diaphoretic.  HENT:     Head: Normocephalic and atraumatic.     Nose: Nose normal.  Eyes:     General:        Right eye: No discharge.        Left eye: No discharge.     Conjunctiva/sclera: Conjunctivae normal.  Neck:     Trachea: No tracheal deviation.  Cardiovascular:     Rate and Rhythm: Normal rate and regular rhythm.  Pulmonary:     Effort: Pulmonary effort is normal. No respiratory distress.     Breath  sounds: No stridor.  Musculoskeletal:     Cervical back: Normal. No bony tenderness. No pain with movement. Normal range of motion.     Thoracic back: Normal. No tenderness or bony tenderness. Normal range of motion.     Lumbar back: No spasms, tenderness or bony tenderness. Normal range of motion. Positive right straight leg raise test. Negative left straight leg raise test.     Left hip: Tenderness present. No deformity or crepitus. Normal range of motion.     Comments: Left hip - ttp to greater trochanter area Pain illicited with left hip internal rotation  Skin:    General: Skin is warm and dry.     Findings: No rash.  Neurological:     Mental Status: She is alert.     Motor: No abnormal muscle tone.     Coordination: Coordination normal.  Psychiatric:        Behavior: Behavior normal.      Results for orders placed or performed in visit on 02/21/23  CBC w/Diff/Platelet  Result Value Ref Range   WBC 5.7 3.8 - 10.8 Thousand/uL   RBC 3.84 3.80 - 5.10 Million/uL   Hemoglobin 11.5 (L) 11.7 - 15.5 g/dL   HCT 16.1 09.6 - 04.5 %   MCV 92.7 80.0 - 100.0 fL   MCH 29.9 27.0 - 33.0 pg   MCHC 32.3 32.0 - 36.0 g/dL   RDW 40.9 81.1 - 91.4 %   Platelets 293 140 - 400 Thousand/uL   MPV 10.1 7.5 - 12.5 fL   Neutro Abs 3,642 1,500 - 7,800 cells/uL   Lymphs Abs 1,721 850 - 3,900 cells/uL   Absolute Monocytes 228 200 - 950 cells/uL   Eosinophils Absolute 80 15 - 500 cells/uL   Basophils Absolute 29 0 - 200 cells/uL   Neutrophils Relative % 63.9 %   Total Lymphocyte 30.2 %   Monocytes Relative 4.0 %   Eosinophils Relative 1.4 %   Basophils Relative 0.5 %  Basic Metabolic Panel (BMET)  Result Value Ref Range   Glucose, Bld 90 65 - 99 mg/dL   BUN 10 7 - 25 mg/dL   Creat 7.82 (H) 9.56 - 1.03 mg/dL   BUN/Creatinine Ratio 10 6 - 22 (calc)   Sodium 141 135 - 146 mmol/L   Potassium 3.6 3.5 - 5.3 mmol/L   Chloride 104 98 - 110 mmol/L   CO2 32 20 - 32 mmol/L   Calcium 8.6 8.6 - 10.4 mg/dL   Lipid Profile  Result Value Ref Range   Cholesterol 217 (H) <200 mg/dL   HDL  82 > OR = 50 mg/dL   Triglycerides 57 <161 mg/dL   LDL Cholesterol (Calc) 121 (H) mg/dL (calc)   Total CHOL/HDL Ratio 2.6 <5.0 (calc)   Non-HDL Cholesterol (Calc) 135 (H) <130 mg/dL (calc)       Assessment & Plan:   Pt presents with acute left low back and left hip pain that began after starting to walk and exercise more she notes it particularly flared up after walking on incline. Had been severe and she had even been taking home narcotics but this morning it seemed to be starting to improve. There was no injury, negative straight leg raise on exam today no radicular symptoms so we will hold off on steroids for now.  She has not done any anti-inflammatory medications or heat therapy -to advise that we start with this and she can also use Tylenol over-the-counter.  Since she saw surgeon improvement the last day of encouraged her to avoid strenuous exercise or walking on an incline for the rest of the week while doing heat therapy gentle stretching when pain is not flared up and with taking Mobic and Tylenol.  If she has any worsening of the left low back pain or develops radicular symptoms I would want her to have a burst of steroids and consult with PT for assessment and treatment If her left hip pain with tenderness over the greater trochanter area does not improve she may need to see Ortho  For now we can do conservative management and watchful waiting.    ICD-10-CM   1. Acute left-sided low back pain without sciatica  M54.50 meloxicam (MOBIC) 15 MG tablet   left low back and SI joint area ttp, neg SLR, no radicular sx, improving, rest, tx with NSAIDs and piriformis stretches in a few days, hold off on steroids    2. Left hip pain  M25.552 meloxicam (MOBIC) 15 MG tablet   possible IT band vs bursitis?     At this time I did not feel like x-rays were indicated with no injury and with improving symptoms If  low back or left hip pain persisted or lasted more than a month some screening x-rays would be appropriate. Strongly encouraged her to consider physical therapy if her symptoms do not resolve in the next couple weeks.  Plan and meds reviewed with pt - I encouraged her to send [place] a message if she needed a PT referral soon and I would be happy to put that in without having to see her again.     Danelle Berry, PA-C 02/26/23 10:23 AM

## 2023-03-27 ENCOUNTER — Other Ambulatory Visit: Payer: Self-pay | Admitting: Family Medicine

## 2023-03-27 DIAGNOSIS — M545 Low back pain, unspecified: Secondary | ICD-10-CM

## 2023-03-27 DIAGNOSIS — M25552 Pain in left hip: Secondary | ICD-10-CM

## 2023-04-02 NOTE — Telephone Encounter (Signed)
Yes she wants refill

## 2023-05-01 ENCOUNTER — Other Ambulatory Visit: Payer: Self-pay | Admitting: Internal Medicine

## 2023-05-01 DIAGNOSIS — Z1231 Encounter for screening mammogram for malignant neoplasm of breast: Secondary | ICD-10-CM

## 2023-05-02 LAB — LIPID PANEL
Cholesterol: 226 — AB (ref 0–200)
HDL: 84 — AB (ref 35–70)
LDL Cholesterol: 132
Triglycerides: 56 (ref 40–160)

## 2023-05-02 LAB — BASIC METABOLIC PANEL
Free T4: 10.2 ng/dL
Glucose: 82
TSH: 2.25 (ref 0.41–5.90)

## 2023-05-07 ENCOUNTER — Other Ambulatory Visit: Payer: Self-pay | Admitting: Internal Medicine

## 2023-05-07 DIAGNOSIS — M545 Low back pain, unspecified: Secondary | ICD-10-CM

## 2023-05-07 DIAGNOSIS — M25552 Pain in left hip: Secondary | ICD-10-CM

## 2023-05-07 NOTE — Telephone Encounter (Signed)
Requested Prescriptions  Pending Prescriptions Disp Refills   meloxicam (MOBIC) 15 MG tablet [Pharmacy Med Name: MELOXICAM 15MG  TABLETS] 30 tablet 2    Sig: TAKE 1 TABLET(15 MG) BY MOUTH DAILY     Analgesics:  COX2 Inhibitors Failed - 05/07/2023  8:54 AM      Failed - Manual Review: Labs are only required if the patient has taken medication for more than 8 weeks.      Failed - HGB in normal range and within 360 days    Hemoglobin  Date Value Ref Range Status  02/21/2023 11.5 (L) 11.7 - 15.5 g/dL Final  56/21/3086 57.8 11.1 - 15.9 g/dL Final         Failed - Cr in normal range and within 360 days    Creat  Date Value Ref Range Status  02/21/2023 1.04 (H) 0.50 - 1.03 mg/dL Final         Failed - AST in normal range and within 360 days    AST  Date Value Ref Range Status  11/22/2021 17 10 - 35 U/L Final         Failed - ALT in normal range and within 360 days    ALT  Date Value Ref Range Status  11/22/2021 8 6 - 29 U/L Final         Failed - eGFR is 30 or above and within 360 days    eGFR  Date Value Ref Range Status  11/22/2021 55 (L) > OR = 60 mL/min/1.48m2 Final    Comment:    The eGFR is based on the CKD-EPI 2021 equation. To calculate  the new eGFR from a previous Creatinine or Cystatin C result, go to https://www.kidney.org/professionals/ kdoqi/gfr%5Fcalculator   01/22/2021 71 >59 mL/min/1.73 Final         Passed - HCT in normal range and within 360 days    HCT  Date Value Ref Range Status  02/21/2023 35.6 35.0 - 45.0 % Final   Hematocrit  Date Value Ref Range Status  01/22/2021 33.7 (L) 34.0 - 46.6 % Final         Passed - Patient is not pregnant      Passed - Valid encounter within last 12 months    Recent Outpatient Visits           2 months ago Acute left-sided low back pain without sciatica   [facility] Danelle Berry, PA-C   2 months ago Primary hypertension   [facility] Margarita Mail,  DO   5 months ago Pain of left anterior lower extremity   [facility] Mecum, Oswaldo Conroy, PA-C   6 months ago Primary hypertension   [facility] Margarita Mail, DO   8 months ago Primary hypertension   [facility] Margarita Mail, DO       Future Appointments             In 9 months Margarita Mail, DO [facility]

## 2023-06-12 ENCOUNTER — Ambulatory Visit
Admission: RE | Admit: 2023-06-12 | Discharge: 2023-06-12 | Disposition: A | Discharge status: Home / Self Care | Payer: No Typology Code available for payment source | Source: Ambulatory Visit | Attending: Internal Medicine | Admitting: Internal Medicine

## 2023-06-12 DIAGNOSIS — Z1231 Encounter for screening mammogram for malignant neoplasm of breast: Secondary | ICD-10-CM | POA: Insufficient documentation

## 2023-09-09 ENCOUNTER — Ambulatory Visit: Payer: No Typology Code available for payment source | Admitting: Internal Medicine

## 2023-09-09 ENCOUNTER — Encounter: Payer: Self-pay | Admitting: Internal Medicine

## 2023-09-09 ENCOUNTER — Other Ambulatory Visit: Payer: Self-pay

## 2023-09-09 VITALS — BP 130/82 | HR 92 | Temp 98.0°F | Resp 16 | Ht 72.0 in | Wt 297.4 lb

## 2023-09-09 DIAGNOSIS — S39012A Strain of muscle, fascia and tendon of lower back, initial encounter: Secondary | ICD-10-CM | POA: Diagnosis not present

## 2023-09-09 DIAGNOSIS — R748 Abnormal levels of other serum enzymes: Secondary | ICD-10-CM | POA: Diagnosis not present

## 2023-09-09 DIAGNOSIS — D649 Anemia, unspecified: Secondary | ICD-10-CM | POA: Diagnosis not present

## 2023-09-09 DIAGNOSIS — R7402 Elevation of levels of lactic acid dehydrogenase (LDH): Secondary | ICD-10-CM

## 2023-09-09 MED ORDER — MELOXICAM 7.5 MG PO TABS: 7.5000 mg | ORAL_TABLET | Freq: Two times a day (BID) | ORAL | 0 refills | Status: AC

## 2023-09-09 MED ORDER — TIZANIDINE HCL 4 MG PO TABS: 4.0000 mg | ORAL_TABLET | Freq: Every evening | ORAL | 0 refills | Status: DC | PRN

## 2023-09-09 NOTE — Progress Notes (Signed)
 Established Patient Office Visit  Subjective   Patient ID: Makinze Jani, female    DOB: 07-Feb-1966  Age: 58 y.o. MRN: [id number]  Chief Complaint  Patient presents with   Consult    Discuss labs from work    HPI  Komal Stangelo presents to discuss labs she had back in September at [facility] and to discuss back pain.  Labs showing isolated elevated alkaline phosphatase at 129, LDH elevated at 311, normal LFTs and normal GGT.  Creatinine also slightly elevated at 1.11.  Cholesterol panel also elevated, total cholesterol 226, LDL 132, HDL 84, triglycerides 56. CBC, thyroid panel normal.   BACK PAIN Duration:  1 month Mechanism of injury: no trauma Location: midline and low back Onset: gradual Severity: moderate Quality: dull and aching Frequency: constant Radiation: none Aggravating factors: laying and bending Alleviating factors: NSAIDs Status: worse Treatments attempted: ibuprofen  Relief with NSAIDs?: significant Nighttime pain:  no Paresthesias / decreased sensation:  no Bowel / bladder incontinence:  no Fevers:  no Dysuria / urinary frequency:  no  Hypertension: -Medications: HCTZ 25 mg, Amlodipine  10 mg  -Failed Meds: Lisinopril hives and swelling  -Patient is compliant with above medications and reports no side effects. -Checking BP at home (average):120-130/70-80 -Denies any SOB, CP, vision changes, LE edema or symptoms of hypotension -Exercise:water aerobics  HLD: -Medications: Nothing -Last lipid panel: Lipid Panel     Component Value Date/Time   CHOL 217 (H) 02/21/2023 0827   TRIG 57 02/21/2023 0827   HDL 82 02/21/2023 0827   CHOLHDL 2.6 02/21/2023 0827   LDLCALC 121 (H) 02/21/2023 0827   The 10-year ASCVD risk score (Arnett DK, et al., 2019) is: 5.1%   Values used to calculate the score:     Age: 67 years     Sex: Female     Is Non-Hispanic African American: Yes     Diabetic: No     Tobacco smoker: No     Systolic Blood Pressure: 130 mmHg      Is BP treated: Yes     HDL Cholesterol: 82 mg/dL     Total Cholesterol: 217 mg/dL  Dyspareunia:  -Started within the last few years  -Now seeing Gynecology, was started on estradiol  10 mg vaginal tablets but does not feel like this is working well   Health Maintenance: -Blood work due -Mammogram 11/24 - Birads-1 -Pap 11/23 negative -Colonoscopy 12/2016, one polyp removed unable to view pathology, repeat in 10 years - obtaining records  Patient Active Problem List   Diagnosis Date Noted   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 06/18/2022   Primary hypertension 11/22/2021   Primary osteoarthritis of right knee 10/12/2021   Primary osteoarthritis of left knee 10/12/2021   Past Medical History:  Diagnosis Date   Hypertension    Past Surgical History:  Procedure Laterality Date   BREAST BIOPSY  2009   THYROIDECTOMY, PARTIAL     Social History   Tobacco Use   Smoking status: Never   Smokeless tobacco: Never  Vaping Use   Vaping status: Never Used  Substance Use Topics   Alcohol use: Yes    Comment: very rare   Drug use: Never   Social History   Socioeconomic History   Marital status: Married    Spouse name: Not on file   Number of children: Not on file   Years of education: Not on file   Highest education level: Master's degree (e.g., MA, MS, MEng, MEd, MSW, MBA)  Occupational History   Not on file  Tobacco Use   Smoking status: Never   Smokeless tobacco: Never  Vaping Use   Vaping status: Never Used  Substance and Sexual Activity   Alcohol use: Yes    Comment: very rare   Drug use: Never   Sexual activity: Yes    Birth control/protection: Post-menopausal  Other Topics Concern   Not on file  Social History Narrative   Not on file   Social Drivers of Health   Financial Resource Strain: Low Risk  (09/05/2023)   Overall Financial Resource Strain (CARDIA)    Difficulty of Paying Living Expenses: Not very hard  Food Insecurity: No Food Insecurity  (09/05/2023)   Hunger Vital Sign    Worried About Running Out of Food in the Last Year: Never true    Ran Out of Food in the Last Year: Never true  Transportation Needs: No Transportation Needs (09/05/2023)   PRAPARE - Administrator, Civil Service (Medical): No    Lack of Transportation (Non-Medical): No  Physical Activity: Insufficiently Active (09/05/2023)   Exercise Vital Sign    Days of Exercise per Week: 3 days    Minutes of Exercise per Session: 30 min  Stress: Stress Concern Present (09/05/2023)   [employer] of Occupational Health - Occupational Stress Questionnaire    Feeling of Stress : To some extent  Social Connections: Socially Integrated (09/05/2023)   Social Connection and Isolation Panel [NHANES]    Frequency of Communication with Friends and Family: More than three times a week    Frequency of Social Gatherings with Friends and Family: Once a week    Attends Religious Services: More than 4 times per year    Active Member of [employer] or Organizations: Yes    Attends Engineer, Structural: More than 4 times per year    Marital Status: Married  Catering Manager Violence: Not on file   Family Status  Relation Name Status   Mother  Deceased   Father  Deceased   Brother 2 Alive   Cousin Paternal 1st Alive  No partnership data on file   Family History  Problem Relation Age of Onset   Breast cancer Mother 73   Hypertension Father    Heart attack Father    Breast cancer Cousin        twice, not sure of age   Allergies  Allergen Reactions   Aspirin Hives and Swelling   Lisinopril Swelling    Hives, swelling, cough, scratchy throat      Review of Systems  Constitutional:  Negative for chills and fever.  Genitourinary:  Negative for dysuria, flank pain, frequency, hematuria and urgency.  Musculoskeletal:  Positive for back pain.      Objective:     BP 130/82 (Cuff Size: Large)   Pulse 92   Temp 98 F (36.7 C) (Oral)   Resp 16   Ht  6' (1.829 m)   Wt 297 lb 6.4 oz (134.9 kg)   SpO2 98%   BMI 40.33 kg/m  BP Readings from Last 3 Encounters:  09/09/23 130/82  02/26/23 138/86  02/21/23 122/86   Wt Readings from Last 3 Encounters:  09/09/23 297 lb 6.4 oz (134.9 kg)  02/26/23 (!) 304 lb 14.4 oz (138.3 kg)  02/21/23 (!) 301 lb 4.8 oz (136.7 kg)      Physical Exam Constitutional:      Appearance: Normal appearance.  HENT:     Head: Normocephalic  and atraumatic.  Eyes:     Conjunctiva/sclera: Conjunctivae normal.  Cardiovascular:     Rate and Rhythm: Normal rate and regular rhythm.  Pulmonary:     Effort: Pulmonary effort is normal.     Breath sounds: Normal breath sounds.  Musculoskeletal:     Lumbar back: No spasms, tenderness or bony tenderness. Normal range of motion.  Skin:    General: Skin is warm and dry.  Neurological:     General: No focal deficit present.     Mental Status: She is alert. Mental status is at baseline.  Psychiatric:        Mood and Affect: Mood normal.        Behavior: Behavior normal.      No results found for any visits on 09/09/23.  Last CBC Lab Results  Component Value Date   WBC 5.7 02/21/2023   HGB 11.5 (L) 02/21/2023   HCT 35.6 02/21/2023   MCV 92.7 02/21/2023   MCH 29.9 02/21/2023   RDW 15.0 02/21/2023   PLT 293 02/21/2023   Last metabolic panel Lab Results  Component Value Date   GLUCOSE 90 02/21/2023   NA 141 02/21/2023   K 3.6 02/21/2023   CL 104 02/21/2023   CO2 32 02/21/2023   BUN 10 02/21/2023   CREATININE 1.04 (H) 02/21/2023   EGFR 55 (L) 11/22/2021   CALCIUM 8.6 02/21/2023   PROT 7.0 11/22/2021   ALBUMIN 4.0 01/22/2021   LABGLOB 2.5 01/22/2021   AGRATIO 1.6 01/22/2021   BILITOT 0.4 11/22/2021   ALKPHOS 89 01/22/2021   AST 17 11/22/2021   ALT 8 11/22/2021   Last lipids Lab Results  Component Value Date   CHOL 217 (H) 02/21/2023   HDL 82 02/21/2023   LDLCALC 121 (H) 02/21/2023   TRIG 57 02/21/2023   CHOLHDL 2.6 02/21/2023   Last  hemoglobin A1c No results found for: HGBA1C Last thyroid functions No results found for: TSH, T3TOTAL, T4TOTAL, THYROIDAB Last vitamin D No results found for: 25OHVITD2, 25OHVITD3, VD25OH Last vitamin B12 and Folate No results found for: VITAMINB12, FOLATE    The 10-year ASCVD risk score (Arnett DK, et al., 2019) is: 5.1%    Assessment & Plan:   1. Alkaline phosphatase elevation (Primary): Mildly elevated, GGT normal, will repeat today.   - Comprehensive Metabolic Panel (CMET)  2. Anemia, unspecified type/Elevated LDH: Hemoglobin normal on recent labs but has had elevated LDH and haptoglobin in the past will recheck labs.   - CBC w/Diff/Platelet - Comprehensive Metabolic Panel (CMET) - Lactate dehydrogenase - Haptoglobin  3. Strain of lumbar region, initial encounter: Exam consistent with musculoskeletal strain, will prescribe scheduled anti-inflammatories, muscle relaxer as needed and print stretches to do regularly.   - meloxicam  (MOBIC ) 7.5 MG tablet; Take 1 tablet (7.5 mg total) by mouth 2 (two) times daily for 14 days.  Dispense: 28 tablet; Refill: 0 - tiZANidine  (ZANAFLEX ) 4 MG tablet; Take 1 tablet (4 mg total) by mouth at bedtime as needed for muscle spasms.  Dispense: 30 tablet; Refill: 0   Return for already scheduled.    Sharyle Fischer, DO

## 2023-09-09 NOTE — Patient Instructions (Signed)
 Low Back Sprain or Strain Rehab Ask your health care provider which exercises are safe for you. Do exercises exactly as told by your health care provider and adjust them as directed. It is normal to feel mild stretching, pulling, tightness, or discomfort as you do these exercises. Stop right away if you feel sudden pain or your pain gets worse. Do not begin these exercises until told by your health care provider. Stretching and range-of-motion exercises These exercises warm up your muscles and joints and improve the movement and flexibility of your back. These exercises also help to relieve pain, numbness, and tingling. Lumbar rotation  Lie on your back on a firm bed or the floor with your knees bent. Straighten your arms out to your sides so each arm forms a 90-degree angle (right angle) with a side of your body. Slowly move (rotate) both of your knees to one side of your body until you feel a stretch in your lower back (lumbar). Try not to let your shoulders lift off the floor. Hold this position for __________ seconds. Tense your abdominal muscles and slowly move your knees back to the starting position. Repeat this exercise on the other side of your body. Repeat __________ times. Complete this exercise __________ times a day. Single knee to chest  Lie on your back on a firm bed or the floor with both legs straight. Bend one of your knees. Use your hands to move your knee up toward your chest until you feel a gentle stretch in your lower back and buttock. Hold your leg in this position by holding on to the front of your knee. Keep your other leg as straight as possible. Hold this position for __________ seconds. Slowly return to the starting position. Repeat with your other leg. Repeat __________ times. Complete this exercise __________ times a day. Prone extension on elbows  Lie on your abdomen on a firm bed or the floor (prone position). Prop yourself up on your elbows. Use your arms  to help lift your chest up until you feel a gentle stretch in your abdomen and your lower back. This will place some of your body weight on your elbows. If this is uncomfortable, try stacking pillows under your chest. Your hips should stay down, against the surface that you are lying on. Keep your hip and back muscles relaxed. Hold this position for __________ seconds. Slowly relax your upper body and return to the starting position. Repeat __________ times. Complete this exercise __________ times a day. Strengthening exercises These exercises build strength and endurance in your back. Endurance is the ability to use your muscles for a long time, even after they get tired. Pelvic tilt This exercise strengthens the muscles that lie deep in the abdomen. Lie on your back on a firm bed or the floor with your legs extended. Bend your knees so they are pointing toward the ceiling and your feet are flat on the floor. Tighten your lower abdominal muscles to press your lower back against the floor. This motion will tilt your pelvis so your tailbone points up toward the ceiling instead of pointing to your feet or the floor. To help with this exercise, you may place a small towel under your lower back and try to push your back into the towel. Hold this position for __________ seconds. Let your muscles relax completely before you repeat this exercise. Repeat __________ times. Complete this exercise __________ times a day. Alternating arm and leg raises  Get on your hands  and knees on a firm surface. If you are on a hard floor, you may want to use padding, such as an exercise mat, to cushion your knees. Line up your arms and legs. Your hands should be directly below your shoulders, and your knees should be directly below your hips. Lift your left leg behind you. At the same time, raise your right arm and straighten it in front of you. Do not lift your leg higher than your hip. Do not lift your arm higher  than your shoulder. Keep your abdominal and back muscles tight. Keep your hips facing the ground. Do not arch your back. Keep your balance carefully, and do not hold your breath. Hold this position for __________ seconds. Slowly return to the starting position. Repeat with your right leg and your left arm. Repeat __________ times. Complete this exercise __________ times a day. Abdominal set with straight leg raise  Lie on your back on a firm bed or the floor. Bend one of your knees and keep your other leg straight. Tense your abdominal muscles and lift your straight leg up, 4-6 inches (10-15 cm) off the ground. Keep your abdominal muscles tight and hold this position for __________ seconds. Do not hold your breath. Do not arch your back. Keep it flat against the ground. Keep your abdominal muscles tense as you slowly lower your leg back to the starting position. Repeat with your other leg. Repeat __________ times. Complete this exercise __________ times a day. Single leg lower with bent knees Lie on your back on a firm bed or the floor. Tense your abdominal muscles and lift your feet off the floor, one foot at a time, so your knees and hips are bent in 90-degree angles (right angles). Your knees should be over your hips and your lower legs should be parallel to the floor. Keeping your abdominal muscles tense and your knee bent, slowly lower one of your legs so your toe touches the ground. Lift your leg back up to return to the starting position. Do not hold your breath. Do not let your back arch. Keep your back flat against the ground. Repeat with your other leg. Repeat __________ times. Complete this exercise __________ times a day. Posture and body mechanics Good posture and healthy body mechanics can help to relieve stress in your body's tissues and joints. Body mechanics refers to the movements and positions of your body while you do your daily activities. Posture is part of body  mechanics. Good posture means: Your spine is in its natural S-curve position (neutral). Your shoulders are pulled back slightly. Your head is not tipped forward (neutral). Follow these guidelines to improve your posture and body mechanics in your everyday activities. Standing  When standing, keep your spine neutral and your feet about hip-width apart. Keep a slight bend in your knees. Your ears, shoulders, and hips should line up. When you do a task in which you stand in one place for a long time, place one foot up on a stable object that is 2-4 inches (5-10 cm) high, such as a footstool. This helps keep your spine neutral. Sitting  When sitting, keep your spine neutral and keep your feet flat on the floor. Use a footrest, if necessary, and keep your thighs parallel to the floor. Avoid rounding your shoulders, and avoid tilting your head forward. When working at a desk or a computer, keep your desk at a height where your hands are slightly lower than your elbows. Slide your  chair under your desk so you are close enough to maintain good posture. When working at a computer, place your monitor at a height where you are looking straight ahead and you do not have to tilt your head forward or downward to look at the screen. Resting When lying down and resting, avoid positions that are most painful for you. If you have pain with activities such as sitting, bending, stooping, or squatting, lie in a position in which your body does not bend very much. For example, avoid curling up on your side with your arms and knees near your chest (fetal position). If you have pain with activities such as standing for a long time or reaching with your arms, lie with your spine in a neutral position and bend your knees slightly. Try the following positions: Lying on your side with a pillow between your knees. Lying on your back with a pillow under your knees. Lifting  When lifting objects, keep your feet at least  shoulder-width apart and tighten your abdominal muscles. Bend your knees and hips and keep your spine neutral. It is important to lift using the strength of your legs, not your back. Do not lock your knees straight out. Always ask for help to lift heavy or awkward objects. This information is not intended to replace advice given to you by your health care provider. Make sure you discuss any questions you have with your health care provider. Document Revised: 11/25/2022 Document Reviewed: 10/09/2020 Elsevier Patient Education  2024 [employer].

## 2023-09-10 ENCOUNTER — Encounter: Payer: Self-pay | Admitting: Internal Medicine

## 2023-09-10 LAB — COMPREHENSIVE METABOLIC PANEL
ALT: 7 [IU]/L (ref 0–32)
AST: 18 [IU]/L (ref 0–40)
Albumin: 4.2 g/dL (ref 3.8–4.9)
Alkaline Phosphatase: 108 [IU]/L (ref 44–121)
BUN/Creatinine Ratio: 12 (ref 9–23)
BUN: 12 mg/dL (ref 6–24)
Bilirubin Total: 0.4 mg/dL (ref 0.0–1.2)
CO2: 25 mmol/L (ref 20–29)
Calcium: 8.9 mg/dL (ref 8.7–10.2)
Chloride: 102 mmol/L (ref 96–106)
Creatinine, Ser: 1.01 mg/dL — ABNORMAL HIGH (ref 0.57–1.00)
Globulin, Total: 2.9 g/dL (ref 1.5–4.5)
Glucose: 79 mg/dL (ref 70–99)
Potassium: 3.9 mmol/L (ref 3.5–5.2)
Sodium: 143 mmol/L (ref 134–144)
Total Protein: 7.1 g/dL (ref 6.0–8.5)
eGFR: 65 mL/min/{1.73_m2} (ref >59)

## 2023-09-10 LAB — CBC WITH DIFFERENTIAL/PLATELET
Basophils Absolute: 0 10*3/uL (ref 0.0–0.2)
Basos: 1 %
EOS (ABSOLUTE): 0 10*3/uL (ref 0.0–0.4)
Eos: 0 %
Hematocrit: 36.4 % (ref 34.0–46.6)
Hemoglobin: 12.2 g/dL (ref 11.1–15.9)
Immature Grans (Abs): 0 10*3/uL (ref 0.0–0.1)
Immature Granulocytes: 0 %
Lymphocytes Absolute: 2 10*3/uL (ref 0.7–3.1)
Lymphs: 23 %
MCH: 31.7 pg (ref 26.6–33.0)
MCHC: 33.5 g/dL (ref 31.5–35.7)
MCV: 95 fL (ref 79–97)
Monocytes Absolute: 0.4 10*3/uL (ref 0.1–0.9)
Monocytes: 5 %
Neutrophils Absolute: 6 10*3/uL (ref 1.4–7.0)
Neutrophils: 71 %
Platelets: 303 10*3/uL (ref 150–450)
RBC: 3.85 x10E6/uL (ref 3.77–5.28)
RDW: 13.5 % (ref 11.7–15.4)
WBC: 8.5 10*3/uL (ref 3.4–10.8)

## 2023-09-10 LAB — LACTATE DEHYDROGENASE: LDH: 297 [IU]/L — ABNORMAL HIGH (ref 119–226)

## 2023-09-10 LAB — HAPTOGLOBIN: Haptoglobin: 225 mg/dL (ref 33–346)

## 2023-09-13 ENCOUNTER — Ambulatory Visit (INDEPENDENT_AMBULATORY_CARE_PROVIDER_SITE_OTHER): Payer: No Typology Code available for payment source

## 2023-09-13 ENCOUNTER — Ambulatory Visit
Admission: RE | Admit: 2023-09-13 | Discharge: 2023-09-13 | Disposition: A | Discharge status: Home / Self Care | Payer: No Typology Code available for payment source | Source: Ambulatory Visit | Attending: Emergency Medicine | Admitting: Emergency Medicine

## 2023-09-13 VITALS — BP 133/74 | HR 88 | Temp 97.8°F | Resp 18

## 2023-09-13 DIAGNOSIS — M25562 Pain in left knee: Secondary | ICD-10-CM

## 2023-09-13 NOTE — ED Provider Notes (Signed)
 Julia Ashley    CSN: [id number] Arrival date & time: 09/13/23  1235      History   Chief Complaint Chief Complaint  Patient presents with   Knee Pain    I had a cortisone injection on 09/04/23 and since then my knee hurts to where I can barely put pressure on it when walking.  Requesting xray - Entered by patient    HPI Julia Ashley is a 58 y.o. female.  Patient presents with >1 week history of left knee pain.  No falls or injury.  The pain is worse with weightbearing and ambulation.  It improves with rest.  No open wounds, redness, unusual swelling.  No numbness or weakness in her feet or lower legs.  No calf pain.  Her pain started after she received a cortisone shot in the knee.  She gets the cortisone shots every 3 months at her orthopedist in [place].  Patient has a small bruise at the site of the cortisone injection but no other bruising.  She requests an x-ray of her knee.  The history is provided by the patient and medical records.    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 Authorizing Provider  amLODipine  (NORVASC ) 10 MG tablet Take 1 tablet (10 mg total) by mouth daily. 02/21/23   Bernardo Fend, DO  Estradiol  10 MCG TABS vaginal tablet 1 tablet per vagina 2 times per week. 06/18/22   Dove, Myra C, MD   hydrochlorothiazide  (HYDRODIURIL ) 25 MG tablet Take 1 tablet (25 mg total) by mouth daily. 02/21/23   Bernardo Fend, DO  meloxicam  (MOBIC ) 7.5 MG tablet Take 1 tablet (7.5 mg total) by mouth 2 (two) times daily for 14 days. 09/09/23 09/23/23  Bernardo Fend, DO  tiZANidine  (ZANAFLEX ) 4 MG tablet Take 1 tablet (4 mg total) by mouth at bedtime as needed for muscle spasms. 09/09/23   Bernardo Fend, DO    Family History Family History  Problem Relation Age of Onset   Breast cancer Mother 31   Hypertension Father    Heart attack Father    Breast cancer Cousin        twice, not sure of age    Social History Social History   Tobacco Use   Smoking status: Never   Smokeless tobacco: Never  Vaping Use   Vaping status: Never Used  Substance Use Topics   Alcohol use: Yes    Comment: very rare   Drug use: Never     Allergies  Aspirin and Lisinopril   Review of Systems Review of Systems  Constitutional:  Negative for chills and fever.  Musculoskeletal:  Positive for arthralgias. Negative for gait problem and joint swelling.  Skin:  Negative for color change, rash and wound.  Neurological:  Negative for weakness and numbness.     Physical Exam Triage Vital Signs ED Triage Vitals  Encounter Vitals Group     BP 09/13/23 1253 133/74     Systolic BP Percentile --      Diastolic BP Percentile --      Pulse Rate 09/13/23 1243 88     Resp 09/13/23 1243 18     Temp 09/13/23 1243 97.8 F (36.6 C)     Temp src --      SpO2 09/13/23 1243 96 %     Weight --      Height --      Head Circumference --      Peak Flow --      Pain Score 09/13/23 1250 10     Pain Loc --      Pain Education --      Exclude from Growth Chart --    No data found.  Updated Vital Signs BP 133/74   Pulse 88   Temp 97.8 F (36.6 C)   Resp 18   SpO2 96%   Visual Acuity Right Eye Distance:   Left Eye Distance:   Bilateral Distance:    Right Eye Near:   Left Eye Near:    Bilateral  Near:     Physical Exam Constitutional:      General: She is not in acute distress. HENT:     Mouth/Throat:     Mouth: Mucous membranes are moist.  Cardiovascular:     Rate and Rhythm: Normal rate and regular rhythm.  Pulmonary:     Effort: Pulmonary effort is normal. No respiratory distress.  Musculoskeletal:        General: No swelling, tenderness or deformity. Normal range of motion.  Skin:    General: Skin is warm and dry.     Capillary Refill: Capillary refill takes less than 2 seconds.     Findings: Bruising present. No erythema, lesion or rash.     Comments: Small bruise at the site of cortisone injection but no other color change or bruising.  Neurological:     General: No focal deficit present.     Mental Status: She is alert and oriented to person, place, and time.     Sensory: No sensory deficit.     Motor: No weakness.     Gait: Gait normal.      UC Treatments / Results  Labs (all labs ordered are listed, but only abnormal results are displayed) Labs Reviewed - No data to display  EKG   Radiology DG Knee Complete 4 Views Left Result Date: 09/13/2023 CLINICAL DATA:  Left knee pain. History of arthritis with cortisone injection. EXAM: LEFT KNEE - COMPLETE 4+ VIEW COMPARISON:  Left knee radiograph dated 11/25/2022. FINDINGS: There is no acute fracture or dislocation. The bones are osteopenic. Mild arthritic changes of the knee with mild joint space narrowing and spurring. No significant joint effusion. The soft tissues are unremarkable. IMPRESSION: 1. No acute fracture or dislocation. 2. Mild arthritic changes. Electronically Signed   By: Vanetta Chou M.D.   On: 09/13/2023 13:31    Procedures Procedures (including critical care time)  Medications Ordered in UC Medications - No data to display  Initial Impression / Assessment and Plan / UC Course  I have reviewed the triage vital signs and the nursing notes.  Pertinent labs & imaging results that were  available during my care of the patient were reviewed by me and considered in my medical decision making (see chart for details).   Left knee pain.  No trauma.  No indication of infection.  Afebrile and vital signs are stable.  Xray shows mild arthritis but no fracture or dislocation.  Discussed Tylenol  or ibuprofen as needed for discomfort.  Rest, elevation, ice packs.  Instructed patient to follow-up with her PCP or orthopedist on Monday.  Her orthopedist is in [place].  She agrees to plan of care.  Final Clinical Impressions(s) / UC Diagnoses   Final diagnoses:  Left knee pain, unspecified chronicity     Discharge Instructions      The xray is pending.  Follow-up with your primary care provider or orthopedist on Monday.     ED Prescriptions   None    PDMP not reviewed this encounter.   Corlis Burnard DEL, NP 09/13/23 [phone]

## 2023-09-13 NOTE — Discharge Instructions (Addendum)
 The xray is pending.  Follow-up with your primary care provider or orthopedist on Monday.

## 2023-09-13 NOTE — ED Triage Notes (Signed)
 Patient to Urgent Care with complaints of left sided knee pain that started 9 days ago after receiving a cortisone shot. Denies any other cause/ known injury.   Receives cortisone injection every 3 months. Injection was on the lateral side of her knee but is feeling pain on the medial side of her knee. Spoke with her Physician via phone yesterday. Requests a weighted x-ray.   Using a heating pad. Able to bend her knee w/o pain but pain is sharp w/ weight bearing.

## 2023-11-07 ENCOUNTER — Other Ambulatory Visit: Payer: Self-pay | Admitting: Internal Medicine

## 2023-11-07 DIAGNOSIS — I10 Essential (primary) hypertension: Secondary | ICD-10-CM

## 2023-11-07 NOTE — Telephone Encounter (Signed)
 Requested Prescriptions  Pending Prescriptions Disp Refills   amLODipine (NORVASC) 10 MG tablet [Pharmacy Med Name: AMLODIPINE BESYLATE 10MG  TABLETS] 90 tablet 1    Sig: TAKE 1 TABLET(10 MG) BY MOUTH DAILY     Cardiovascular: Calcium Channel Blockers 2 Passed - 11/07/2023  4:03 PM      Passed - Last BP in normal range    BP Readings from Last 1 Encounters:  09/13/23 133/74         Passed - Last Heart Rate in normal range    Pulse Readings from Last 1 Encounters:  09/13/23 88         Passed - Valid encounter within last 6 months    Recent Outpatient Visits           1 month ago Alkaline phosphatase elevation   [facility] Margarita Mail, DO       Future Appointments             In 3 months Margarita Mail, DO [facility], [facility]

## 2023-11-10 ENCOUNTER — Other Ambulatory Visit: Payer: Self-pay | Admitting: Internal Medicine

## 2023-11-10 DIAGNOSIS — S39012A Strain of muscle, fascia and tendon of lower back, initial encounter: Secondary | ICD-10-CM

## 2023-11-10 DIAGNOSIS — I10 Essential (primary) hypertension: Secondary | ICD-10-CM

## 2023-11-11 NOTE — Telephone Encounter (Signed)
 Requested Prescriptions  Pending Prescriptions Disp Refills   hydrochlorothiazide (HYDRODIURIL) 25 MG tablet [Pharmacy Med Name: HYDROCHLOROTHIAZIDE 25MG TABLETS] 90 tablet 1    Sig: TAKE 1 TABLET(25 MG) BY MOUTH DAILY     Cardiovascular: Diuretics - Thiazide Failed - 11/11/2023  3:45 PM      Failed - Cr in normal range and within 180 days    Creat  Date Value Ref Range Status  02/21/2023 1.04 (H) 0.50 - 1.03 mg/dL Final   Creatinine, Ser  Date Value Ref Range Status  09/09/2023 1.01 (H) 0.57 - 1.00 mg/dL Final         Passed - K in normal range and within 180 days    Potassium  Date Value Ref Range Status  09/09/2023 3.9 3.5 - 5.2 mmol/L Final         Passed - Na in normal range and within 180 days    Sodium  Date Value Ref Range Status  09/09/2023 143 134 - 144 mmol/L Final         Passed - Last BP in normal range    BP Readings from Last 1 Encounters:  09/13/23 133/74         Passed - Valid encounter within last 6 months    Recent Outpatient Visits           2 months ago Alkaline phosphatase elevation   [facility] Margarita Mail, DO       Future Appointments             In 3 months Margarita Mail, DO [facility]            Refused Prescriptions Disp Refills   meloxicam (MOBIC) 7.5 MG tablet [Pharmacy Med Name: MELOXICAM 7.5MG  TABLETS] 28 tablet 0    Sig: TAKE 1 TABLET(7.5 MG) BY MOUTH TWICE DAILY FOR 14 DAYS     Analgesics:  COX2 Inhibitors Failed - 11/11/2023  3:45 PM      Failed - Manual Review: Labs are only required if the patient has taken medication for more than 8 weeks.      Failed - Cr in normal range and within 360 days    Creat  Date Value Ref Range Status  02/21/2023 1.04 (H) 0.50 - 1.03 mg/dL Final   Creatinine, Ser  Date Value Ref Range Status  09/09/2023 1.01 (H) 0.57 - 1.00 mg/dL Final         Passed - HGB in normal range and within 360 days    Hemoglobin  Date  Value Ref Range Status  09/09/2023 12.2 11.1 - 15.9 g/dL Final         Passed - HCT in normal range and within 360 days    Hematocrit  Date Value Ref Range Status  09/09/2023 36.4 34.0 - 46.6 % Final         Passed - AST in normal range and within 360 days    AST  Date Value Ref Range Status  09/09/2023 18 0 - 40 IU/L Final         Passed - ALT in normal range and within 360 days    ALT  Date Value Ref Range Status  09/09/2023 7 0 - 32 IU/L Final         Passed - eGFR is 30 or above and within 360 days    eGFR  Date Value Ref Range Status  09/09/2023 65 >59 mL/min/1.73 Final  Passed - Patient is not pregnant      Passed - Valid encounter within last 12 months    Recent Outpatient Visits           2 months ago Alkaline phosphatase elevation   [facility] Margarita Mail, DO       Future Appointments             In 3 months Margarita Mail, DO [facility], [facility]

## 2024-01-01 ENCOUNTER — Ambulatory Visit: Admitting: Internal Medicine

## 2024-01-01 ENCOUNTER — Other Ambulatory Visit: Payer: Self-pay

## 2024-01-01 ENCOUNTER — Encounter: Payer: Self-pay | Admitting: Internal Medicine

## 2024-01-01 VITALS — BP 128/82 | HR 98 | Temp 98.5°F | Resp 18 | Ht 72.0 in | Wt 293.8 lb

## 2024-01-01 DIAGNOSIS — R051 Acute cough: Secondary | ICD-10-CM

## 2024-01-01 DIAGNOSIS — J029 Acute pharyngitis, unspecified: Secondary | ICD-10-CM | POA: Diagnosis not present

## 2024-01-01 LAB — POC COVID19/FLU A&B COMBO
Covid Antigen, POC: NEGATIVE
Influenza A Antigen, POC: NEGATIVE
Influenza B Antigen, POC: NEGATIVE

## 2024-01-01 LAB — POCT RAPID STREP A (OFFICE): Rapid Strep A Screen: NEGATIVE

## 2024-01-01 MED ORDER — BENZONATATE 100 MG PO CAPS
100.0000 mg | ORAL_CAPSULE | Freq: Two times a day (BID) | ORAL | 0 refills | Status: DC | PRN
Start: 2024-01-01 — End: 2024-02-24

## 2024-01-01 NOTE — Progress Notes (Signed)
 Acute Office Visit  Subjective:     Patient ID: Julia Ashley, female    DOB: [birth year], 58 y.o.   MRN: [id number]  Chief Complaint  Patient presents with   Sore Throat   Cough    For 1 week     HPI Patient is in today for sore throat and dry cough for 1 week.  Discussed the use of AI scribe software for clinical note transcription with the patient, who gave verbal consent to proceed.  History of Present Illness Julia Ashley is a 58 year old female who presents with a persistent dry cough and sore throat.  She has had a dry cough and sore throat for one week, with the sore throat worsening by Friday. The cough is non-productive, and there is a sensation of congestion without mucus. She uses NyQuil for symptom relief.  She experiences eye discharge upon waking, affecting one eye, with no pain on eye movement. No similar symptoms in household members.  There is no shortness of breath or wheezing. Symptoms began before a recent trip out of town. She has mild allergies triggered by specific allergens like fresh cut grass.    Review of Systems  Constitutional:  Negative for chills and fever.  HENT:  Positive for congestion and sore throat. Negative for ear pain and sinus pain.   Respiratory:  Positive for cough. Negative for sputum production, shortness of breath and wheezing.         Objective:    BP 128/82 (Cuff Size: Large)   Pulse 98   Temp 98.5 F (36.9 C) (Oral)   Resp 18   Ht 6' (1.829 m)   Wt 293 lb 12.8 oz (133.3 kg)   SpO2 99%   BMI 39.85 kg/m    Physical Exam Constitutional:      Appearance: Normal appearance.  HENT:     Head: Normocephalic and atraumatic.     Right Ear: Ear canal and external ear normal.     Left Ear: Ear canal and external ear normal.     Ears:     Comments: Bilateral eustachian tube dysfunction    Nose: Congestion present.     Mouth/Throat:     Mouth: Mucous membranes are moist.     Comments: Mild PND Eyes:      Conjunctiva/sclera: Conjunctivae normal.  Cardiovascular:     Rate and Rhythm: Normal rate and regular rhythm.  Pulmonary:     Effort: Pulmonary effort is normal.     Breath sounds: Normal breath sounds. No wheezing, rhonchi or rales.  Skin:    General: Skin is warm and dry.  Neurological:     General: No focal deficit present.     Mental Status: She is alert. Mental status is at baseline.  Psychiatric:        Mood and Affect: Mood normal.        Behavior: Behavior normal.     No results found for any visits on 01/01/24.      Assessment & Plan:   Assessment & Plan Acute viral upper respiratory infection Symptoms suggest viral etiology with improvement noted. No bacterial infection or COVID-19/flu. Flonase recommended for inflammation, with caution for dryness and epistaxis. Oral steroids considered but with more side effects. - Flonase nasal spray, two sprays each nostril twice daily for 10 days. - Over-the-counter Allegra or Claritin for allergy symptoms. - Prescribe cough medicine  - Encourage rest and hydration. - Suggest vitamin C and zinc supplementation. - Discussed lidocaine   throat spray for severe throat pain, though not preferred.  Eustachian tube dysfunction Fluid and pressure likely due to sinus drainage from viral infection or allergies. No infection present. - Flonase nasal spray to reduce inflammation.  - POC Covid19/Flu A&B Antigen - POCT rapid strep A - benzonatate (TESSALON) 100 MG capsule; Take 1 capsule (100 mg total) by mouth 2 (two) times daily as needed for cough.  Dispense: 20 capsule; Refill: 0    Return if symptoms worsen or fail to improve.  Rockney Cid, DO

## 2024-02-05 ENCOUNTER — Other Ambulatory Visit: Payer: Self-pay | Admitting: Internal Medicine

## 2024-02-05 DIAGNOSIS — I10 Essential (primary) hypertension: Secondary | ICD-10-CM

## 2024-02-09 NOTE — Telephone Encounter (Signed)
 Requested Prescriptions  Pending Prescriptions Disp Refills   amLODipine  (NORVASC ) 10 MG tablet [Pharmacy Med Name: AMLODIPINE  BESYLATE 10MG  TABLETS] 90 tablet 0    Sig: TAKE 1 TABLET(10 MG) BY MOUTH DAILY     Cardiovascular: Calcium Channel Blockers 2 Passed - 02/09/2024  1:43 PM      Passed - Last BP in normal range    BP Readings from Last 1 Encounters:  01/01/24 128/82         Passed - Last Heart Rate in normal range    Pulse Readings from Last 1 Encounters:  01/01/24 98         Passed - Valid encounter within last 6 months    Recent Outpatient Visits           1 month ago Sore throat   [facility] Bernardo Fend, DO   5 months ago Alkaline phosphatase elevation   [facility] Bernardo Fend, DO       Future Appointments             In 2 weeks Bernardo Fend, DO [facility], [facility]

## 2024-02-24 ENCOUNTER — Ambulatory Visit: Payer: Self-pay | Admitting: Internal Medicine

## 2024-02-24 ENCOUNTER — Other Ambulatory Visit: Payer: Self-pay

## 2024-02-24 ENCOUNTER — Encounter: Payer: Self-pay | Admitting: Internal Medicine

## 2024-02-24 VITALS — BP 128/82 | HR 81 | Temp 98.0°F | Resp 16 | Ht 72.0 in | Wt 289.3 lb

## 2024-02-24 DIAGNOSIS — I10 Essential (primary) hypertension: Secondary | ICD-10-CM

## 2024-02-24 DIAGNOSIS — R600 Localized edema: Secondary | ICD-10-CM | POA: Diagnosis not present

## 2024-02-24 DIAGNOSIS — M545 Low back pain, unspecified: Secondary | ICD-10-CM

## 2024-02-24 MED ORDER — MELOXICAM 15 MG PO TABS: 15.0000 mg | ORAL_TABLET | Freq: Every day | ORAL | 0 refills | Status: AC

## 2024-02-24 NOTE — Progress Notes (Signed)
 Established Patient Office Visit  Subjective    Patient ID: Julia Ashley, female    DOB: 05/23/1966  Age: 58 y.o. MRN: [id number]  CC:  Chief Complaint  Patient presents with   Medical Management of Chronic Issues    HPI Julia Ashley presents to follow up on chronic medical conditions.   Discussed the use of AI scribe software for clinical note transcription with the patient, who gave verbal consent to proceed.  History of Present Illness Julia Ashley is a 58 year old female with chronic back pain who presents for a routine checkup and management of her back pain.  She experiences persistent low back pain, similar to menstrual discomfort, now constant and more intense. The pain is localized to the lower back, worsens with bending, and does not radiate. There is no numbness, tingling, or muscle spasms. Adhesive pain patches provide some relief but cause a rash due to adhesive sensitivity. Muscle relaxers are ineffective and cause undesirable side effects. Meloxicam  was previously effective, but she is concerned about potential interactions with other medications. An allergy to aspirin limits her use of certain anti-inflammatory medications.  She experiences swelling in her ankles and takes hydrochlorothiazide  25 mg to manage this condition. The swelling is not severe enough to cause pitting edema but can be uncomfortable, especially after activities like getting a pedicure. Compression stockings are used at home to help manage the swelling. She is mindful of her salt intake, avoiding salty foods, especially from restaurants and processed foods, and focuses on cooking at home with fresh ingredients.  Hypertension: -Medications: HCTZ 25 mg, Amlodipine  10 mg  -Failed Meds: Lisinopril hives and swelling  -Patient is compliant with above medications and reports no side effects. -Checking BP at home (average):120-130/70-80 -Denies any SOB, CP, vision changes, LE edema or symptoms of  hypotension -Exercise:water aerobics  HLD: -Medications: Nothing -Last lipid panel: Lipid Panel     Component Value Date/Time   CHOL 226 (A) 05/02/2023 0000   TRIG 56 05/02/2023 0000   HDL 84 (A) 05/02/2023 0000   CHOLHDL 2.6 02/21/2023 0827   LDLCALC 132 05/02/2023 0000   LDLCALC 121 (H) 02/21/2023 0827   The 10-year ASCVD risk score (Arnett DK, et al., 2019) is: 4.9%*   Values used to calculate the score:     Age: 31 years     Clincally relevant sex: Female     Is Non-Hispanic African American: Yes     Diabetic: No     Tobacco smoker: No     Systolic Blood Pressure: 128 mmHg     Is BP treated: Yes     HDL Cholesterol: 84 mg/dL*     Total Cholesterol: 226 mg/dL*     * - Cholesterol units were assumed for this score calculation  Dyspareunia:  -Started within the last few years  -Now seeing Gynecology, was started on estradiol  10 mg vaginal tablets but does not feel like this is working well   Health Maintenance: -Blood work UTD -Mammogram 11/24 - Birads-1 -Pap 11/23 negative -Colonoscopy 12/2016, one polyp removed unable to view pathology, repeat in 10 years - obtaining records  Outpatient Encounter Medications as of 02/24/2024  Medication Sig   amLODipine  (NORVASC ) 10 MG tablet TAKE 1 TABLET(10 MG) BY MOUTH DAILY   Estradiol  10 MCG TABS vaginal tablet 1 tablet per vagina 2 times per week.   hydrochlorothiazide  (HYDRODIURIL ) 25 MG tablet TAKE 1 TABLET(25 MG) BY MOUTH DAILY   tiZANidine  (ZANAFLEX ) 4 MG tablet Take 1 tablet (  4 mg total) by mouth at bedtime as needed for muscle spasms.   benzonatate  (TESSALON ) 100 MG capsule Take 1 capsule (100 mg total) by mouth 2 (two) times daily as needed for cough. (Patient not taking: Reported on 02/24/2024)   No facility-administered encounter medications on file as of 02/24/2024.    Past Medical History:  Diagnosis Date   Hypertension     Past Surgical History:  Procedure Laterality Date   BREAST BIOPSY  2009   THYROIDECTOMY,  PARTIAL      Family History  Problem Relation Age of Onset   Breast cancer Mother 33   Hypertension Father    Heart attack Father    Breast cancer Cousin        twice, not sure of age    Social History   Socioeconomic History   Marital status: Married    Spouse name: Not on file   Number of children: Not on file   Years of education: Not on file   Highest education level: Master's degree (e.g., MA, MS, MEng, MEd, MSW, MBA)  Occupational History   Not on file  Tobacco Use   Smoking status: Never   Smokeless tobacco: Never  Vaping Use   Vaping status: Never Used  Substance and Sexual Activity   Alcohol use: Yes    Comment: very rare   Drug use: Never   Sexual activity: Yes    Birth control/protection: Post-menopausal  Other Topics Concern   Not on file  Social History Narrative   Not on file   Social Drivers of Health   Financial Resource Strain: Low Risk  (02/20/2024)   Overall Financial Resource Strain (CARDIA)    Difficulty of Paying Living Expenses: Not very hard  Food Insecurity: No Food Insecurity (02/20/2024)   Hunger Vital Sign    Worried About Running Out of Food in the Last Year: Never true    Ran Out of Food in the Last Year: Never true  Transportation Needs: No Transportation Needs (02/20/2024)   PRAPARE - Administrator, Civil Service (Medical): No    Lack of Transportation (Non-Medical): No  Physical Activity: Sufficiently Active (02/20/2024)   Exercise Vital Sign    Days of Exercise per Week: 3 days    Minutes of Exercise per Session: 90 min  Stress: No Stress Concern Present (02/20/2024)   Julia Ashley of Occupational Health - Occupational Stress Questionnaire    Feeling of Stress: Only a little  Social Connections: Socially Integrated (02/20/2024)   Social Connection and Isolation Panel    Frequency of Communication with Friends and Family: Twice a week    Frequency of Social Gatherings with Friends and Family: Once a week     Attends Religious Services: More than 4 times per year    Active Member of [employer] or Organizations: Yes    Attends Engineer, structural: More than 4 times per year    Marital Status: Married  Catering manager Violence: Not on file    Review of Systems  All other systems reviewed and are negative.       Objective    BP 128/82 (Cuff Size: Large)   Pulse 81   Temp 98 F (36.7 C) (Oral)   Resp 16   Ht 6' (1.829 m)   Wt 289 lb 4.8 oz (131.2 kg)   BMI 39.24 kg/m   Physical Exam Constitutional:      Appearance: Normal appearance.  HENT:     Head: Normocephalic  and atraumatic.  Eyes:     Conjunctiva/sclera: Conjunctivae normal.  Cardiovascular:     Rate and Rhythm: Normal rate and regular rhythm.  Pulmonary:     Effort: Pulmonary effort is normal.     Breath sounds: Normal breath sounds.  Musculoskeletal:     Right lower leg: Edema present.     Left lower leg: Edema present.     Comments: Mild BLE non-pitting edema  Skin:    General: Skin is warm and dry.  Neurological:     General: No focal deficit present.     Mental Status: She is alert. Mental status is at baseline.  Psychiatric:        Mood and Affect: Mood normal.        Behavior: Behavior normal.       Assessment & Plan:   Assessment & Plan Low Back Pain Persistent non-radiating low back pain likely musculoskeletal due to muscle tightness and inflammation. Arthritis considered; spinal stenosis and nerve impingement unlikely. X-ray deferred pending treatment response. - Prescribe meloxicam  15 mg once daily with food for 14 days. - Advise against chronic use of anti-inflammatories due to potential renal and gastrointestinal side effects. - Instruct to pair meloxicam  with muscle relaxer at bedtime. - Recommend stretching exercises, specifically child's pose. - Consider x-ray if no improvement after 2 weeks. - Discuss potential use of steroids if current treatment is ineffective, with caution due to  impact on A1c.  Venous Insufficiency Chronic venous insufficiency with ankle swelling. Lasix considered but deferred due to renal impact and current blood pressure control. - Continue hydrochlorothiazide  25 mg daily. - Use compression stockings at home, especially when legs are dependent. - Elevate legs in the evening to heart level. - Advise on reducing sodium intake, particularly from processed and restaurant foods. - Monitor for pitting edema and report if it occurs.  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 - Mark adhesive as an allergy in medical records. - Recommend over-the-counter Benadryl  cream for itching relief. - Discuss the trade-off between pain relief from patches and allergic reaction.  HTN/General Health Maintenance Blood pressure well-controlled. Cholesterol last checked in September 2023. Pap smear and mammogram up to date. Colonoscopy due in 2028. - Plan comprehensive labs at next visit, including cholesterol. - Refill blood pressure medications in the fall as needed. - Schedule follow-up in six months for routine check-up and labs.  - meloxicam  (MOBIC ) 15 MG tablet; Take 1 tablet (15 mg total) by mouth daily for 14 days.  Dispense: 14 tablet; Refill: 0  Return in about 6 months (around 08/26/2024).   Sharyle Fischer, DO

## 2024-05-04 ENCOUNTER — Encounter: Payer: Self-pay | Admitting: Internal Medicine

## 2024-05-04 ENCOUNTER — Other Ambulatory Visit: Payer: Self-pay | Admitting: Internal Medicine

## 2024-05-04 DIAGNOSIS — M545 Low back pain, unspecified: Secondary | ICD-10-CM

## 2024-05-04 MED ORDER — MELOXICAM 7.5 MG PO TABS: 7.5000 mg | ORAL_TABLET | Freq: Every day | ORAL | 0 refills | Status: DC | PRN

## 2024-05-05 ENCOUNTER — Other Ambulatory Visit: Payer: Self-pay | Admitting: Internal Medicine

## 2024-05-05 DIAGNOSIS — I10 Essential (primary) hypertension: Secondary | ICD-10-CM

## 2024-05-06 ENCOUNTER — Other Ambulatory Visit: Payer: Self-pay | Admitting: Internal Medicine

## 2024-05-06 DIAGNOSIS — Z1231 Encounter for screening mammogram for malignant neoplasm of breast: Secondary | ICD-10-CM

## 2024-05-06 NOTE — Telephone Encounter (Signed)
 Requested Prescriptions  Pending Prescriptions Disp Refills   amLODipine  (NORVASC ) 10 MG tablet [Pharmacy Med Name: AMLODIPINE  BESYLATE 10MG  TABLETS] 90 tablet 0    Sig: TAKE 1 TABLET(10 MG) BY MOUTH DAILY     Cardiovascular: Calcium Channel Blockers 2 Passed - 05/06/2024  2:57 PM      Passed - Last BP in normal range    BP Readings from Last 1 Encounters:  02/24/24 128/82         Passed - Last Heart Rate in normal range    Pulse Readings from Last 1 Encounters:  02/24/24 81         Passed - Valid encounter within last 6 months    Recent Outpatient Visits           2 months ago Acute left-sided low back pain without sciatica   [facility] Bernardo Fend, DO   4 months ago Sore throat   [facility] Bernardo Fend, DO   8 months ago Alkaline phosphatase elevation   [facility] Bernardo Fend, DO       Future Appointments             In 3 months Bernardo Fend, DO [facility] [place]

## 2024-05-10 ENCOUNTER — Other Ambulatory Visit: Payer: Self-pay | Admitting: Internal Medicine

## 2024-05-10 DIAGNOSIS — I10 Essential (primary) hypertension: Secondary | ICD-10-CM

## 2024-05-11 NOTE — Telephone Encounter (Signed)
 Requested medications are due for refill today.  yes  Requested medications are on the active medications list.  yes  Last refill. 11/11/2023 #90 1 rf  Future visit scheduled.   yes  Notes to clinic.  Labs are expired.    Requested Prescriptions  Pending Prescriptions Disp Refills   hydrochlorothiazide  (HYDRODIURIL ) 25 MG tablet [Pharmacy Med Name: HYDROCHLOROTHIAZIDE  25MG  TABLETS] 90 tablet 1    Sig: TAKE 1 TABLET(25 MG) BY MOUTH DAILY     Cardiovascular: Diuretics - Thiazide Failed - 05/11/2024  1:42 PM      Failed - Cr in normal range and within 180 days    Creat  Date Value Ref Range Status  02/21/2023 1.04 (H) 0.50 - 1.03 mg/dL Final   Creatinine, Ser  Date Value Ref Range Status  09/09/2023 1.01 (H) 0.57 - 1.00 mg/dL Final         Failed - K in normal range and within 180 days    Potassium  Date Value Ref Range Status  09/09/2023 3.9 3.5 - 5.2 mmol/L Final         Failed - Na in normal range and within 180 days    Sodium  Date Value Ref Range Status  09/09/2023 143 134 - 144 mmol/L Final         Passed - Last BP in normal range    BP Readings from Last 1 Encounters:  02/24/24 128/82         Passed - Valid encounter within last 6 months    Recent Outpatient Visits           2 months ago Acute left-sided low back pain without sciatica   [facility] Bernardo Fend, DO   4 months ago Sore throat   [facility] Bernardo Fend, DO   8 months ago Alkaline phosphatase elevation   [facility] Bernardo Fend, DO       Future Appointments             In 3 months Bernardo Fend, DO [facility] [place]

## 2024-06-01 ENCOUNTER — Other Ambulatory Visit: Payer: Self-pay | Admitting: Internal Medicine

## 2024-06-01 DIAGNOSIS — M545 Low back pain, unspecified: Secondary | ICD-10-CM

## 2024-06-02 NOTE — Telephone Encounter (Signed)
 Requested medication (s) are due for refill today: na  Requested medication (s) are on the active medication list: yes  Last refill:  05/04/24 #30 0 refills  Future visit scheduled: yes in 2 months 08/26/24  Notes to clinic:  no refills remain. Do you want to refill Rx?     Requested Prescriptions  Pending Prescriptions Disp Refills   meloxicam  (MOBIC ) 7.5 MG tablet [Pharmacy Med Name: MELOXICAM  7.5MG  TABLETS] 30 tablet 0    Sig: TAKE 1 TABLET(7.5 MG) BY MOUTH DAILY AS NEEDED FOR PAIN     Analgesics:  COX2 Inhibitors Failed - 06/02/2024  3:56 PM      Failed - Manual Review: Labs are only required if the patient has taken medication for more than 8 weeks.      Failed - Cr in normal range and within 360 days    Creat  Date Value Ref Range Status  02/21/2023 1.04 (H) 0.50 - 1.03 mg/dL Final   Creatinine, Ser  Date Value Ref Range Status  09/09/2023 1.01 (H) 0.57 - 1.00 mg/dL Final         Passed - HGB in normal range and within 360 days    Hemoglobin  Date Value Ref Range Status  09/09/2023 12.2 11.1 - 15.9 g/dL Final         Passed - HCT in normal range and within 360 days    Hematocrit  Date Value Ref Range Status  09/09/2023 36.4 34.0 - 46.6 % Final         Passed - AST in normal range and within 360 days    AST  Date Value Ref Range Status  09/09/2023 18 0 - 40 IU/L Final         Passed - ALT in normal range and within 360 days    ALT  Date Value Ref Range Status  09/09/2023 7 0 - 32 IU/L Final         Passed - eGFR is 30 or above and within 360 days    eGFR  Date Value Ref Range Status  09/09/2023 65 >59 mL/min/1.73 Final         Passed - Patient is not pregnant      Passed - Valid encounter within last 12 months    Recent Outpatient Visits           3 months ago Acute left-sided low back pain without sciatica   [facility] Bernardo Fend, DO   5 months ago Sore throat   [facility]  Bernardo Fend, DO   8 months ago Alkaline phosphatase elevation   [facility] Bernardo Fend, DO       Future Appointments             In 2 months Bernardo Fend, DO [facility], [place]

## 2024-06-04 NOTE — Telephone Encounter (Signed)
 Would like refill if it is ok. If it will interact with other meds its ok. Was given to her for back pain

## 2024-06-07 ENCOUNTER — Other Ambulatory Visit: Payer: Self-pay | Admitting: Medical Genetics

## 2024-06-07 ENCOUNTER — Other Ambulatory Visit
Admission: RE | Admit: 2024-06-07 | Discharge: 2024-06-07 | Disposition: A | Discharge status: Home / Self Care | Payer: Self-pay | Source: Ambulatory Visit | Attending: Medical Genetics | Admitting: Medical Genetics

## 2024-06-14 ENCOUNTER — Ambulatory Visit
Admission: RE | Admit: 2024-06-14 | Discharge: 2024-06-14 | Disposition: A | Discharge status: Home / Self Care | Source: Ambulatory Visit | Attending: Internal Medicine | Admitting: Internal Medicine

## 2024-06-14 DIAGNOSIS — Z1231 Encounter for screening mammogram for malignant neoplasm of breast: Secondary | ICD-10-CM | POA: Insufficient documentation

## 2024-06-15 LAB — GENECONNECT MOLECULAR SCREEN: Genetic Analysis Overall Interpretation: NEGATIVE

## 2024-06-16 ENCOUNTER — Other Ambulatory Visit: Payer: Self-pay | Admitting: Internal Medicine

## 2024-06-16 DIAGNOSIS — R928 Other abnormal and inconclusive findings on diagnostic imaging of breast: Secondary | ICD-10-CM

## 2024-06-17 ENCOUNTER — Ambulatory Visit
Admission: RE | Admit: 2024-06-17 | Discharge: 2024-06-17 | Disposition: A | Discharge status: Home / Self Care | Source: Ambulatory Visit | Attending: Internal Medicine | Admitting: Internal Medicine

## 2024-06-17 DIAGNOSIS — R928 Other abnormal and inconclusive findings on diagnostic imaging of breast: Secondary | ICD-10-CM | POA: Insufficient documentation

## 2024-06-21 ENCOUNTER — Ambulatory Visit
Admission: RE | Admit: 2024-06-21 | Discharge: 2024-06-21 | Disposition: A | Discharge status: Home / Self Care | Source: Ambulatory Visit | Attending: Internal Medicine | Admitting: Internal Medicine

## 2024-06-21 DIAGNOSIS — R928 Other abnormal and inconclusive findings on diagnostic imaging of breast: Secondary | ICD-10-CM

## 2024-06-21 DIAGNOSIS — N6489 Other specified disorders of breast: Secondary | ICD-10-CM | POA: Diagnosis present

## 2024-06-21 HISTORY — PX: BREAST BIOPSY: SHX20

## 2024-06-21 MED ORDER — LIDOCAINE 1 % OPTIME INJ - NO CHARGE
5.0000 mL | Freq: Once | INTRAMUSCULAR | Status: AC
  Administered 2024-06-21: 5 mL
  Filled 2024-06-21: qty 6

## 2024-06-21 MED ORDER — LIDOCAINE-EPINEPHRINE 1 %-1:100000 IJ SOLN
20.0000 mL | Freq: Once | INTRAMUSCULAR | Status: AC
  Administered 2024-06-21: 20 mL
  Filled 2024-06-21: qty 20

## 2024-06-22 LAB — SURGICAL PATHOLOGY

## 2024-06-24 ENCOUNTER — Ambulatory Visit: Payer: Self-pay | Admitting: Internal Medicine

## 2024-08-26 ENCOUNTER — Ambulatory Visit: Admitting: Internal Medicine

## 2024-08-26 ENCOUNTER — Other Ambulatory Visit: Payer: Self-pay

## 2024-08-26 ENCOUNTER — Encounter: Payer: Self-pay | Admitting: Internal Medicine

## 2024-08-26 VITALS — BP 128/80 | HR 66 | Temp 98.2°F | Resp 16 | Ht 72.0 in | Wt 282.2 lb

## 2024-08-26 DIAGNOSIS — E890 Postprocedural hypothyroidism: Secondary | ICD-10-CM | POA: Diagnosis not present

## 2024-08-26 DIAGNOSIS — M545 Low back pain, unspecified: Secondary | ICD-10-CM

## 2024-08-26 DIAGNOSIS — R1310 Dysphagia, unspecified: Secondary | ICD-10-CM

## 2024-08-26 DIAGNOSIS — S39012A Strain of muscle, fascia and tendon of lower back, initial encounter: Secondary | ICD-10-CM

## 2024-08-26 DIAGNOSIS — I10 Essential (primary) hypertension: Secondary | ICD-10-CM | POA: Diagnosis not present

## 2024-08-26 MED ORDER — MELOXICAM 7.5 MG PO TABS: 7.5000 mg | ORAL_TABLET | Freq: Every day | ORAL | 0 refills | Status: AC | PRN

## 2024-08-26 MED ORDER — TIZANIDINE HCL 4 MG PO TABS: 4.0000 mg | ORAL_TABLET | Freq: Every evening | ORAL | 0 refills | Status: AC | PRN

## 2024-08-26 MED ORDER — AMLODIPINE BESYLATE 10 MG PO TABS: 10.0000 mg | ORAL_TABLET | Freq: Every day | ORAL | 1 refills | Status: AC

## 2024-08-26 NOTE — Progress Notes (Signed)
 "  Established Patient Office Visit  Subjective    Patient ID: Julia Ashley, female    DOB: 03-05-1966  Age: 59 y.o. MRN: [id number]  CC:  Chief Complaint  Patient presents with   Medical Management of Chronic Issues    6 month recheck    HPI Julia Ashley presents to follow up on chronic medical conditions.   Discussed the use of AI scribe software for clinical note transcription with the patient, who gave verbal consent to proceed.  History of Present Illness  Julia Ashley is a 59 year old female with hypertension who presents with persistent back pain.  She has had persistent mid-back pain for over six months, described as tightness. She gets about one hour of relief with a massage gun. She takes meloxicam  and recently added turmeric capsules for inflammation. Pain interferes with sleep. A prior muscle relaxer caused excessive sedation. She has leftover oxycodone  from a prior surgery, which she uses sparingly due to sedative effects.  She previously had a right partial thyroidectomy. She now has difficulty swallowing her nighttime amlodipine  pill, feeling like it gets stuck, but has no trouble swallowing food. She is concerned about a problem with her remaining thyroid tissue.  Hypertension: -Medications: HCTZ 25 mg, Amlodipine  10 mg  -Failed Meds: Lisinopril hives and swelling  -Patient is compliant with above medications and reports no side effects. -Checking BP at home (average):120-130/70-80 -Denies any SOB, CP, vision changes, LE edema or symptoms of hypotension -Exercise:water aerobics  HLD: -Medications: Nothing -Last lipid panel: Lipid Panel     Component Value Date/Time   CHOL 226 (A) 05/02/2023 0000   TRIG 56 05/02/2023 0000   HDL 84 (A) 05/02/2023 0000   CHOLHDL 2.6 02/21/2023 0827   LDLCALC 132 05/02/2023 0000   LDLCALC 121 (H) 02/21/2023 0827   The 10-year ASCVD risk score (Arnett DK, et al., 2019) is: 5.4%*   Values used to calculate the score:      Age: 82 years     Clinically relevant sex: Female     Is Non-Hispanic African American: Yes     Diabetic: No     Tobacco smoker: No     Systolic Blood Pressure: 128 mmHg     Is BP treated: Yes     HDL Cholesterol: 84 mg/dL*     Total Cholesterol: 226 mg/dL*     * - Cholesterol units were assumed for this score calculation  Health Maintenance: -Blood work UTD -Mammogram 11/25 -Pap 11/23 negative -Colonoscopy 12/2016, one polyp removed unable to view pathology, repeat in 10 years - obtaining records  Outpatient Encounter Medications as of 08/26/2024  Medication Sig   amLODipine  (NORVASC ) 10 MG tablet TAKE 1 TABLET(10 MG) BY MOUTH DAILY   Estradiol  10 MCG TABS vaginal tablet 1 tablet per vagina 2 times per week.   hydrochlorothiazide  (HYDRODIURIL ) 25 MG tablet TAKE 1 TABLET(25 MG) BY MOUTH DAILY   meloxicam  (MOBIC ) 7.5 MG tablet Take 1 tablet (7.5 mg total) by mouth daily as needed for pain.   tiZANidine  (ZANAFLEX ) 4 MG tablet Take 1 tablet (4 mg total) by mouth at bedtime as needed for muscle spasms.   No facility-administered encounter medications on file as of 08/26/2024.    Past Medical History:  Diagnosis Date   Hypertension     Past Surgical History:  Procedure Laterality Date   BREAST BIOPSY  2009   BREAST BIOPSY Left 06/21/2024   MM LT BREAST BX W LOC DEV 1ST LESION IMAGE BX SPEC  STEREO GUIDE 06/21/2024 [facility]   THYROIDECTOMY, PARTIAL      Family History  Problem Relation Age of Onset   Breast cancer Mother 29   Hypertension Father    Heart attack Father    Breast cancer Cousin        twice, not sure of age    Social History   Socioeconomic History   Marital status: Married    Spouse name: Not on file   Number of children: Not on file   Years of education: Not on file   Highest education level: Master's degree (e.g., MA, MS, MEng, MEd, MSW, MBA)  Occupational History   Not on file  Tobacco Use   Smoking status: Never   Smokeless tobacco:  Never  Vaping Use   Vaping status: Never Used  Substance and Sexual Activity   Alcohol use: Yes    Comment: very rare   Drug use: Never   Sexual activity: Yes    Birth control/protection: Post-menopausal  Other Topics Concern   Not on file  Social History Narrative   Not on file   Social Drivers of Health   Tobacco Use: Low Risk (08/26/2024)   Patient History    Smoking Tobacco Use: Never    Smokeless Tobacco Use: Never    Passive Exposure: Not on file  Financial Resource Strain: Low Risk (08/22/2024)   Overall Financial Resource Strain (CARDIA)    Difficulty of Paying Living Expenses: Not very hard  Food Insecurity: No Food Insecurity (08/22/2024)   Epic    Worried About Programme Researcher, Broadcasting/film/video in the Last Year: Never true    Ran Out of Food in the Last Year: Never true  Transportation Needs: No Transportation Needs (08/22/2024)   Epic    Lack of Transportation (Medical): No    Lack of Transportation (Non-Medical): No  Physical Activity: Sufficiently Active (08/22/2024)   Exercise Vital Sign    Days of Exercise per Week: 3 days    Minutes of Exercise per Session: 90 min  Stress: No Stress Concern Present (08/22/2024)   [employer] of Occupational Health - Occupational Stress Questionnaire    Feeling of Stress: Only a little  Social Connections: Socially Integrated (08/22/2024)   Social Connection and Isolation Panel    Frequency of Communication with Friends and Family: More than three times a week    Frequency of Social Gatherings with Friends and Family: Once a week    Attends Religious Services: More than 4 times per year    Active Member of Clubs or Organizations: Yes    Attends Banker Meetings: More than 4 times per year    Marital Status: Married  Catering Manager Violence: Not on file  Depression (PHQ2-9): Low Risk (08/26/2024)   Depression (PHQ2-9)    PHQ-2 Score: 0  Alcohol Screen: Low Risk (08/22/2024)   Alcohol Screen    Last Alcohol Screening  Score (AUDIT): 1  Housing: Low Risk (08/22/2024)   Epic    Unable to Pay for Housing in the Last Year: No    Number of Times Moved in the Last Year: 0    Homeless in the Last Year: No  Utilities: Not on file  Health Literacy: Not on file    Review of Systems  Musculoskeletal:  Positive for back pain.  All other systems reviewed and are negative.       Objective    BP 128/80 (Cuff Size: Large)   Pulse 66   Temp 98.2 F (  36.8 C) (Oral)   Resp 16   Ht 6' (1.829 m)   Wt 282 lb 3.2 oz (128 kg)   SpO2 96%   BMI 38.27 kg/m   Physical Exam Constitutional:      Appearance: Normal appearance.  HENT:     Head: Normocephalic and atraumatic.  Eyes:     Conjunctiva/sclera: Conjunctivae normal.  Cardiovascular:     Rate and Rhythm: Normal rate and regular rhythm.  Pulmonary:     Effort: Pulmonary effort is normal.     Breath sounds: Normal breath sounds.  Musculoskeletal:     Lumbar back: Spasms present. No bony tenderness.  Skin:    General: Skin is warm and dry.  Neurological:     General: No focal deficit present.     Mental Status: She is alert. Mental status is at baseline.  Psychiatric:        Mood and Affect: Mood normal.        Behavior: Behavior normal.       Assessment & Plan:   Assessment & Plan  Lumbar strain with chronic low back pain Chronic low back pain likely muscular and positional, with diffuse middle back pain and sleep disturbance. Previous treatments include meloxicam  and muscle relaxers. Massage gun provides temporary relief. Differential includes muscular strain versus structural issues, but symptoms suggest muscular origin. - Ordered lumbar spine x-ray to rule out structural abnormalities. - Referred to physical therapy for structured stretching program. - Prescribed meloxicam  for inflammation. - Prescribed tizanidine  for muscle relaxation, with option to halve dose if side effects occur. - Advised on dietary modifications to reduce  inflammation, including a whole food or Mediterranean diet. - Discussed potential benefits of massage, acupuncture, and chiropractic care.  Primary hypertension Blood pressure well-controlled at 128/80 mmHg with current medication regimen. - Continue current antihypertensive medications: hydrochlorothiazide  and amlodipine . - Refilled amlodipine  prescription.  Dysphagia Difficulty swallowing pills, particularly amlodipine , with sensation of pill getting stuck. No difficulty swallowing food. Partial thyroidectomy for nodules raises concern for thyroid-related issues affecting swallowing. - Ordered thyroid ultrasound to evaluate for nodules or other abnormalities. - Advised on pill swallowing techniques, including using carbonated beverages to aid swallowing. - Discussed option of cutting pills in half if swallowing difficulties persist.  History of right thyroid lobectomy with evaluation for thyroid nodules Partial thyroidectomy due to nodules, with no cancer. Current concern for potential nodules on remaining thyroid lobe due to swallowing difficulties. - Ordered thyroid ultrasound to assess for nodules or other abnormalities. - Provided contact information for ultrasound scheduling.  - amLODipine  (NORVASC ) 10 MG tablet; Take 1 tablet (10 mg total) by mouth daily.  Dispense: 90 tablet; Refill: 1 - tiZANidine  (ZANAFLEX ) 4 MG tablet; Take 1 tablet (4 mg total) by mouth at bedtime as needed for muscle spasms.  Dispense: 30 tablet; Refill: 0 - DG Lumbar Spine Complete; Future - Ambulatory referral to Physical Therapy - meloxicam  (MOBIC ) 7.5 MG tablet; Take 1 tablet (7.5 mg total) by mouth daily as needed for pain.  Dispense: 30 tablet; Refill: 0 - US  THYROID; Future   Return in about 6 months (around 02/23/2025).   Sharyle Fischer, DO   "

## 2024-09-03 ENCOUNTER — Ambulatory Visit

## 2024-09-06 ENCOUNTER — Ambulatory Visit (HOSPITAL_COMMUNITY)

## 2024-09-09 ENCOUNTER — Ambulatory Visit

## 2024-09-09 DIAGNOSIS — M5459 Other low back pain: Secondary | ICD-10-CM

## 2024-09-09 DIAGNOSIS — E890 Postprocedural hypothyroidism: Secondary | ICD-10-CM

## 2024-09-09 DIAGNOSIS — M25651 Stiffness of right hip, not elsewhere classified: Secondary | ICD-10-CM

## 2024-09-09 NOTE — Therapy (Signed)
 " OUTPATIENT PHYSICAL THERAPY THORACOLUMBAR EVALUATION   Patient Name: Julia Ashley MRN: [id number] DOB:02/11/1966, 59 y.o., female Today's Date: 09/09/2024  END OF SESSION:  PT End of Session - 09/09/24 1118     Visit Number 1    Number of Visits 17    Date for Recertification  11/05/24    PT Start Time 1120    PT Stop Time 1213    PT Time Calculation (min) 53 min    Activity Tolerance Patient tolerated treatment well    Behavior During Therapy [facility] for tasks assessed/performed          Past Medical History:  Diagnosis Date   Hypertension    Past Surgical History:  Procedure Laterality Date   BREAST BIOPSY  2009   BREAST BIOPSY Left 06/21/2024   MM LT BREAST BX W LOC DEV 1ST LESION IMAGE BX SPEC STEREO GUIDE 06/21/2024 [facility]   THYROIDECTOMY, PARTIAL     Patient Active Problem List   Diagnosis Date Noted   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 06/18/2022   Primary hypertension 11/22/2021   Primary osteoarthritis of right knee 10/12/2021   Primary osteoarthritis of left knee 10/12/2021    PCP: Bernardo Fend, DO  REFERRING PROVIDER: Bernardo Fend, DO   REFERRING DIAG: D60.987J (ICD-10-CM) - Strain of lumbar region, initial encounter M54.50 (ICD-10-CM) - Acute left-sided low back pain without sciatica  Rationale for Evaluation and Treatment: Rehabilitation  THERAPY DIAG:  Other low back pain - Plan: PT plan of care cert/re-cert  Stiffness of right hip, not elsewhere classified - Plan: PT plan of care cert/re-cert  H/O partial thyroidectomy - Plan: US  THYROID (unable to remove for PT eval diagnosis list. This is not part of PT diagnosis)    ONSET DATE: 08/26/2024 (date PT referral signed, chronic condition)  SUBJECTIVE:                                                                                                                                                                                           SUBJECTIVE STATEMENT: Low  back pain: 8/10 at worst for the past 3 months.   PERTINENT HISTORY:  Low back pain. Gradual onset. Pt has been on meloxicam  and a muscle relaxer on and off with temporary relief. Pain has been present for at least 4 years. Unknown mechanism of injury. Pain has worsened since onset. Pain does not radiate down her lower extremities  Was given exercises by her doctor such as the child's pose but knee pain prevents her from doing so.    Blood pressure is controlled.  No latex allergies   PAIN:  Are  you having pain? Yes: NPRS scale: 7/10 currently (pt currently sitting) Pain location: Low to mid back.  Pain description: really bad tight dull ache.  Aggravating factors: trying to sleep, supine (arches her low back); bending over such as to do dishes, bending over to do anything. Pain is worse when returning from the bent over position (has to lean to the R side prior to going back up when returning from the bent over position). Prolonged standing (about 10 minutes),  standing in line.   Relieving factors: propping her legs in supine (decrease lumbar extension), possibly standing lumbar extension, medication.   PRECAUTIONS: None  RED FLAGS: Bowel or bladder incontinence: No and Cauda equina syndrome: No   WEIGHT BEARING RESTRICTIONS: No  FALLS:  Has patient fallen in last 6 months? No  LIVING ENVIRONMENT: Lives with: lives with their spouse and lives with their daughter Lives in: House/apartment Stairs: Yes: Internal: 15-16 steps; on right going up (first floor set up) Has following equipment at home: None  OCCUPATION: insurance, involves a lot of sitting.   PLOF: Independent  PATIENT GOALS: be able to bend over and sleep more comfortably.   NEXT MD VISIT: July 2026  OBJECTIVE:  Note: Objective measures were completed at Evaluation unless otherwise noted.  DIAGNOSTIC FINDINGS:    PATIENT SURVEYS:  Modified Oswestry:  MODIFIED OSWESTRY DISABILITY SCALE  Date: 09/09/2024  Score  Pain intensity 1 = The pain is bad, but I can manage without having to take pain medication  2. Personal care (washing, dressing, etc.) 0 =  I can take care of myself normally without causing increased pain.  3. Lifting 1 = I can lift heavy weights, but it causes increased pain.  4. Walking 1 = Pain prevents me from walking more than 1 mile.  5. Sitting 1 =  I can only sit in my favorite chair as long as I like.  6. Standing 4 =  Pain prevents me from standing more than 10 minutes.  7. Sleeping 1 = I can sleep well only by using pain medication.  8. Social Life 1 =  My social life is normal, but it increases my level of pain.  9. Traveling 1 =  I can travel anywhere, but it increases my pain.  10. Employment/ Homemaking 2 = I can perform most of my homemaking/job duties, but pain prevents me from performing more physically stressful activities (eg, lifting, vacuuming).  Total 13/50  (26%)   Interpretation of scores: Score Category Description  0-20% Minimal Disability The patient can cope with most living activities. Usually no treatment is indicated apart from advice on lifting, sitting and exercise  21-40% Moderate Disability The patient experiences more pain and difficulty with sitting, lifting and standing. Travel and social life are more difficult and they may be disabled from work. Personal care, sexual activity and sleeping are not grossly affected, and the patient can usually be managed by conservative means  41-60% Severe Disability Pain remains the main problem in this group, but activities of daily living are affected. These patients require a detailed investigation  61-80% Crippled Back pain impinges on all aspects of the patients life. Positive intervention is required  81-100% Bed-bound These patients are either bed-bound or exaggerating their symptoms  Bluford FORBES Zoe DELENA Karon DELENA, et al. Surgery versus conservative management of stable thoracolumbar fracture: the PRESTO  feasibility RCT. [place] ([place]): [employer]; 2021 Nov. [facility] Technology Assessment, No. 25.62.) Appendix 3, Oswestry Disability Index category descriptors. Available  from: Findjewelers.cz  Minimally Clinically Important Difference (MCID) = 12.8%  COGNITION: Overall cognitive status: Within functional limits for tasks assessed     SENSATION: WFL  MUSCLE LENGTH:   POSTURE: forward neck, R shoulder lower, R LE more forward, R hip in ER. Increased lordosis and movement preference around L3/4 area, slight R trunk side bend, decreased B hip extension   Manual R lateral shift correction: decreased lumbar pressure   PALPATION: TTP L L5 and L4 TP   LUMBAR ROM:   AROM eval  Flexion WFL with lumbar stretch feeling. Hinging at around L3/4 area into extension during the return motion with reproduction of back pain  Extension WFL (with hurts so good pain)  Right lateral flexion full  Left lateral flexion full  Right rotation full  Left rotation full   (Blank rows = not tested)  LOWER EXTREMITY ROM:     Passive  Right eval Left eval  Hip flexion    Hip extension 8 8  Hip abduction    Hip adduction    Hip internal rotation 16 stiff end feel 31  Hip external rotation    Knee flexion    Knee extension    Ankle dorsiflexion    Ankle plantarflexion    Ankle inversion    Ankle eversion     (Blank rows = not tested)  LOWER EXTREMITY MMT:    MMT Right eval Left eval  Hip flexion 4 4-  Hip extension 3+ 3+  Hip abduction 4 4-  Hip adduction    Hip internal rotation    Hip external rotation 4 4-  Knee flexion 4+ 4  Knee extension 5 5  Ankle dorsiflexion    Ankle plantarflexion    Ankle inversion    Ankle eversion     (Blank rows = not tested)  LUMBAR SPECIAL TESTS:  Unclear repeated flexion test (-) long sit test.   FUNCTIONAL TESTS:    GAIT: Distance walked: 30 ft Assistive device utilized: None Level of assistance:  Complete Independence Comments: decreased stance L LE, B trendelenberg pattern.   Stairs: decreased B femoral control, demonstrates B genu valgus.   TREATMENT DATE: 09/09/2024                                                                                                                               Therapeutic exercise Seated piriformis stretch   R 30 seconds x 3  Seated hip IR   R 10x3  Seated transversus abdominis contraction 10x5 seconds   Improved exercise technique, movement at target joints, use of target muscles after mod verbal, visual, tactile cues.   No pain in low back after session reported      PATIENT EDUCATION:  Education details: there-ex, HEP, POC Person educated: Patient Education method: Explanation, Demonstration, Tactile cues, Verbal cues, and Handouts Education comprehension: verbalized understanding and returned demonstration  HOME EXERCISE PROGRAM: Access Code: [email or web address] URL: [email or web address] Date: 09/09/2024 Prepared by:  [employer]  Exercises - Seated Piriformis Stretch with Trunk Bend  - 3 x daily - 7 x weekly - 1 sets - 5 reps - 30 seconds to 1 minute hold - Seated Hip Internal Rotation AROM  - 1 x daily - 7 x weekly - 3 sets - 10 reps - Seated Transversus Abdominis Bracing  - 5 x daily - 7 x weekly - 3 sets - 10 reps - 5 seconds hold    ASSESSMENT:  CLINICAL IMPRESSION: Patient is a 59  y.o. female who was seen today for physical therapy evaluation and treatment for low back pain.  She also demonstrates altered gait pattern and posture, decreased lumbopelvic control with increased lumbar hinging when returning from the forward flexed position, limited hip ROM, decreased trunk and hip strength, and difficulty performing tasks which involve standing, bending over as well as sleeping secondary to pain. Pt will benefit from skilled physical therapy services to address the aforementioned deficits.     OBJECTIVE IMPAIRMENTS:  decreased ROM, decreased strength, improper body mechanics, postural dysfunction, and pain.   ACTIVITY LIMITATIONS: lifting, bending, and standing  PARTICIPATION LIMITATIONS:   PERSONAL FACTORS: Age, Fitness, Time since onset of injury/illness/exacerbation, and 1 comorbidity: HTN are also affecting patient's functional outcome.   REHAB POTENTIAL: Fair    CLINICAL DECISION MAKING: Stable/uncomplicated  EVALUATION COMPLEXITY: Low   GOALS: Goals reviewed with patient? Yes  SHORT TERM GOALS: Target date: 09/24/2024  Pt will be independent with her initial HEP to decrease pain, improve strength, function, and ability to perform tasks which involve standing and bending over more comfortably.  Baseline: pt has started her initial HEP (09/09/2024) Goal status: INITIAL    LONG TERM GOALS: Target date: 10/22/2024  Pt will have a decrease in low back pain to 2/10 or less at worst to promote ability to perform standing tasks, bend over, lift, and sleep more comfortably for her back.  Baseline: Low back pain: 8/10 at worst for the past 3 months.  Goal status: INITIAL  2.  Pt will improve her R hip IR ROM by at least 10 degrees to promote ability to ambulate as well as perform standing tasks with less back pain.  Baseline:  Passive  Right eval  Hip internal rotation 16 stiff end feel   Goal status: INITIAL  3.  Pt will improve B hip extension, abduction, ER strength by at least 1/2 MMT grade to promote ability to perform standing tasks, bend over, as well as lift more comfortably for her back.  Baseline:  MMT Right eval Left eval  Hip extension 3+ 3+  Hip abduction 4 4-  Hip external rotation 4 4-   Goal status: INITIAL  4.  Pt will improve her Modified Oswestry Low Back Pain disability Questionnaire by at least 10 % as a demonstration of improved function.  Baseline: 26 % (09/09/2024) Goal status: INITIAL  PLAN:  PT FREQUENCY: 1-2x/week  PT DURATION: 8 weeks  PLANNED  INTERVENTIONS: 97110-Therapeutic exercises, 97530- Therapeutic activity, 97112- Neuromuscular re-education, 97535- Self Care, 02859- Manual therapy, [phone]- Aquatic Therapy, [phone]- Electrical stimulation (unattended), [phone]- Traction (mechanical), [phone]- Ionotophoresis 4mg /ml Dexamethasone, 79439 (1-2 muscles), 20561 (3+ muscles)- Dry Needling, Patient/Family education, Joint mobilization, and Spinal mobilization.  PLAN FOR NEXT SESSION: posture, thoracic extension, trunk, glute med, max strengthening, femoral control, manual techniques, modalities PRN   Reida Hem, PT, DPT 09/09/2024, 4:32 PM  "

## 2024-09-14 ENCOUNTER — Ambulatory Visit

## 2024-09-16 ENCOUNTER — Ambulatory Visit

## 2024-09-22 ENCOUNTER — Ambulatory Visit

## 2024-09-28 ENCOUNTER — Ambulatory Visit

## 2024-09-30 ENCOUNTER — Ambulatory Visit

## 2024-10-06 ENCOUNTER — Ambulatory Visit

## 2024-10-12 ENCOUNTER — Ambulatory Visit

## 2024-10-18 ENCOUNTER — Ambulatory Visit

## 2024-10-20 ENCOUNTER — Ambulatory Visit

## 2024-10-25 ENCOUNTER — Ambulatory Visit

## 2024-10-27 ENCOUNTER — Ambulatory Visit

## 2024-11-01 ENCOUNTER — Ambulatory Visit

## 2024-11-03 ENCOUNTER — Ambulatory Visit

## 2024-11-08 ENCOUNTER — Ambulatory Visit

## 2024-11-10 ENCOUNTER — Ambulatory Visit

## 2025-02-23 ENCOUNTER — Ambulatory Visit: Admitting: Internal Medicine
